# Patient Record
Sex: Female | Born: 1975 | Race: White | Hispanic: No | Marital: Single | Smoking: Never smoker
Health system: Southern US, Community
[De-identification: ages and names within clinical notes are randomized; demographics above are authoritative.]

## PROBLEM LIST (undated history)

## (undated) DIAGNOSIS — D649 Anemia, unspecified: Secondary | ICD-10-CM

## (undated) HISTORY — PX: LAPAROSCOPIC GASTRIC BANDING: SHX1100

## (undated) HISTORY — PX: APPENDECTOMY: SHX54

## (undated) HISTORY — PX: ABDOMINAL HYSTERECTOMY: SHX81

---

## 2011-07-31 DIAGNOSIS — Z9884 Bariatric surgery status: Secondary | ICD-10-CM | POA: Insufficient documentation

## 2015-01-25 DIAGNOSIS — J302 Other seasonal allergic rhinitis: Secondary | ICD-10-CM | POA: Insufficient documentation

## 2015-01-25 DIAGNOSIS — G43109 Migraine with aura, not intractable, without status migrainosus: Secondary | ICD-10-CM | POA: Insufficient documentation

## 2016-12-07 HISTORY — PX: BILATERAL SALPINGOOPHORECTOMY: SHX1223

## 2017-01-05 ENCOUNTER — Ambulatory Visit (INDEPENDENT_AMBULATORY_CARE_PROVIDER_SITE_OTHER): Payer: 59 | Admitting: Family Medicine

## 2017-01-05 ENCOUNTER — Encounter: Payer: Self-pay | Admitting: Family Medicine

## 2017-01-05 DIAGNOSIS — M25561 Pain in right knee: Secondary | ICD-10-CM | POA: Diagnosis not present

## 2017-01-05 MED ORDER — DICLOFENAC SODIUM 75 MG PO TBEC: 75.0000 mg | DELAYED_RELEASE_TABLET | Freq: Two times a day (BID) | ORAL | 1 refills | Status: DC

## 2017-01-05 NOTE — Patient Instructions (Signed)
We will try to get records of the MRI, office notes from the doctor you saw in [place]. These are the different medicines you can take for arthritis: Tylenol 500mg  1-2 tabs three times a day for pain. Voltaren twice a day with food Glucosamine sulfate 750mg  twice a day is a supplement that may help. Capsaicin, aspercreme, or biofreeze topically up to four times a day may also help with pain. Cortisone injections are an option. If cortisone injections do not help, there are different types of shots that may help but they take longer to take effect. It's important that you continue to stay active. Straight leg raises, knee extensions 3 sets of 10 once a day (add ankle weight if these become too easy). Consider physical therapy to strengthen muscles around the joint that hurts to take pressure off of the joint itself. Shoe inserts with good arch support may be helpful. Heat or ice 15 minutes at a time 3-4 times a day as needed to help with pain. Water aerobics and cycling with low resistance are the best two types of exercise for arthritis. Follow up with me if you want to proceed with any of the above.

## 2017-01-06 DIAGNOSIS — M25561 Pain in right knee: Secondary | ICD-10-CM | POA: Insufficient documentation

## 2017-01-06 NOTE — Assessment & Plan Note (Signed)
will obtain records including MRI.  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  Start voltaren.  Tylenol, glucosamine, topical medications discussed.  Shown home exercises to do daily.  Declined physical therapy and intraarticular injection for now.  Heat/ice.  F/u with me if wants to try injection.

## 2017-01-06 NOTE — Progress Notes (Signed)
PCP: No primary care provider on file.  Subjective:   HPI: Patient is a 41 y.o. female here for right knee pain.  Patient reports she fell several times a year ago. Has had swelling of knee, pain level is up to 7/10 and sharp anterior knee. This occurred when she was in [place] - saw physician there and per her report had MRI showing only arthritis and kneecap not tracking properly. Has tried aleve and icing. Did not do physical therapy or injection. No skin changes, numbness.  No past medical history on file.  No current outpatient prescriptions on file prior to visit.   No current facility-administered medications on file prior to visit.     No past surgical history on file.  Allergies  Allergen Reactions  . Codeine   . Penicillins     Social History   Social History  . Marital status: Single    Spouse name: N/A  . Number of children: N/A  . Years of education: N/A   Occupational History  . Not on file.   Social History Main Topics  . Smoking status: Never Smoker  . Smokeless tobacco: Never Used  . Alcohol use Not on file  . Drug use: Unknown  . Sexual activity: Not on file   Other Topics Concern  . Not on file   Social History Narrative  . No narrative on file    No family history on file.  BP 114/84   Pulse 64   Ht 5\' 4"  (1.626 m)   Wt 240 lb (108.9 kg)   BMI 41.20 kg/m   Review of Systems: See HPI above.     Objective:  Physical Exam:  Gen: NAD, comfortable in exam room  Right knee: No gross deformity, ecchymoses, effusion. TTP medial joint line and post patellar facets. FROM. Negative ant/post drawers. Negative valgus/varus testing. Negative lachmanns. Pain with mcmurrays, apleys, patellar apprehension. NV intact distally.  Left knee: FROM without pain.   Assessment & Plan:  1. Right knee pain - will obtain records including MRI.  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  Start voltaren.   Tylenol, glucosamine, topical medications discussed.  Shown home exercises to do daily.  Declined physical therapy and intraarticular injection for now.  Heat/ice.  F/u with me if wants to try injection.

## 2017-02-10 ENCOUNTER — Ambulatory Visit (INDEPENDENT_AMBULATORY_CARE_PROVIDER_SITE_OTHER): Payer: 59 | Admitting: Family Medicine

## 2017-02-10 ENCOUNTER — Encounter: Payer: Self-pay | Admitting: Family Medicine

## 2017-02-10 VITALS — BP 118/72 | HR 81 | Temp 98.2°F | Ht 64.5 in | Wt 271.0 lb

## 2017-02-10 DIAGNOSIS — B372 Candidiasis of skin and nail: Secondary | ICD-10-CM

## 2017-02-10 DIAGNOSIS — Z13220 Encounter for screening for lipoid disorders: Secondary | ICD-10-CM

## 2017-02-10 DIAGNOSIS — Z1329 Encounter for screening for other suspected endocrine disorder: Secondary | ICD-10-CM | POA: Diagnosis not present

## 2017-02-10 DIAGNOSIS — E559 Vitamin D deficiency, unspecified: Secondary | ICD-10-CM | POA: Insufficient documentation

## 2017-02-10 DIAGNOSIS — E669 Obesity, unspecified: Secondary | ICD-10-CM | POA: Insufficient documentation

## 2017-02-10 DIAGNOSIS — Z131 Encounter for screening for diabetes mellitus: Secondary | ICD-10-CM

## 2017-02-10 DIAGNOSIS — R7303 Prediabetes: Secondary | ICD-10-CM | POA: Insufficient documentation

## 2017-02-10 DIAGNOSIS — D509 Iron deficiency anemia, unspecified: Secondary | ICD-10-CM | POA: Diagnosis not present

## 2017-02-10 DIAGNOSIS — R600 Localized edema: Secondary | ICD-10-CM

## 2017-02-10 DIAGNOSIS — R203 Hyperesthesia: Secondary | ICD-10-CM

## 2017-02-10 LAB — HEMOGLOBIN A1C: HEMOGLOBIN A1C: 5.8 % (ref 4.6–6.5)

## 2017-02-10 LAB — COMPREHENSIVE METABOLIC PANEL
ALBUMIN: 3.8 g/dL (ref 3.5–5.2)
ALT: 10 U/L (ref 0–35)
AST: 10 U/L (ref 0–37)
Alkaline Phosphatase: 100 U/L (ref 39–117)
BUN: 8 mg/dL (ref 6–23)
CALCIUM: 9.1 mg/dL (ref 8.4–10.5)
CHLORIDE: 104 meq/L (ref 96–112)
CO2: 29 mEq/L (ref 19–32)
CREATININE: 0.71 mg/dL (ref 0.40–1.20)
GFR: 96.99 mL/min (ref >60.00)
Glucose, Bld: 108 mg/dL — ABNORMAL HIGH (ref 70–99)
POTASSIUM: 3.9 meq/L (ref 3.5–5.1)
Sodium: 138 mEq/L (ref 135–145)
Total Bilirubin: 0.4 mg/dL (ref 0.2–1.2)
Total Protein: 7 g/dL (ref 6.0–8.3)

## 2017-02-10 LAB — CBC
HEMATOCRIT: 38.2 % (ref 36.0–46.0)
HEMOGLOBIN: 12.8 g/dL (ref 12.0–15.0)
MCHC: 33.4 g/dL (ref 30.0–36.0)
MCV: 84.2 fl (ref 78.0–100.0)
PLATELETS: 356 10*3/uL (ref 150.0–400.0)
RBC: 4.53 Mil/uL (ref 3.87–5.11)
RDW: 13.9 % (ref 11.5–15.5)
WBC: 9 10*3/uL (ref 4.0–10.5)

## 2017-02-10 LAB — LIPID PANEL
CHOLESTEROL: 145 mg/dL (ref 0–200)
HDL: 58.7 mg/dL (ref >39.00)
LDL Cholesterol: 67 mg/dL (ref 0–99)
NonHDL: 86.09
Total CHOL/HDL Ratio: 2
Triglycerides: 95 mg/dL (ref 0.0–149.0)
VLDL: 19 mg/dL (ref 0.0–40.0)

## 2017-02-10 LAB — FERRITIN: Ferritin: 138 ng/mL (ref 10.0–291.0)

## 2017-02-10 LAB — TSH: TSH: 1.96 u[IU]/mL (ref 0.35–4.50)

## 2017-02-10 LAB — VITAMIN D 25 HYDROXY (VIT D DEFICIENCY, FRACTURES): VITD: 33.13 ng/mL (ref 30.00–100.00)

## 2017-02-10 MED ORDER — FERROUS SULFATE 325 (65 FE) MG PO TBEC: 325.0000 mg | DELAYED_RELEASE_TABLET | Freq: Two times a day (BID) | ORAL | 1 refills | Status: AC | PRN

## 2017-02-10 MED ORDER — ERGOCALCIFEROL 1.25 MG (50000 UT) PO CAPS: 50000.0000 [IU] | ORAL_CAPSULE | ORAL | 3 refills | Status: DC

## 2017-02-10 MED ORDER — TIZANIDINE HCL 4 MG PO TABS: 4.0000 mg | ORAL_TABLET | Freq: Three times a day (TID) | ORAL | 2 refills | Status: DC | PRN

## 2017-02-10 MED FILL — FERROUS SULFATE 325 MG TAB: 325 (65 FE) | 100 days supply | Qty: 200 | Fill #0

## 2017-02-10 MED FILL — VIT D2 1.25 MG (50,000 UNIT: 1.25 MG | 28 days supply | Qty: 4 | Fill #0

## 2017-02-10 MED FILL — tiZANidine HCL 4 MG TABS: 4 | 10 days supply | Qty: 30 | Fill #0

## 2017-02-10 NOTE — Progress Notes (Unsigned)
Faxed request for records to Dr. Hiram Gashwight Pridhams office.

## 2017-02-10 NOTE — Progress Notes (Signed)
[facility] [address], [address] [place], [place] [address] [phone]  Date:  02/10/2017   Name:  Brianna Raymond   DOB:  February 01, 1976   MRN:  [id number]  PCP:  Abbe Amsterdam, MD    Chief Complaint: Establish Care (Pt here to est care. Would like to discuss getting derm referral )   History of Present Illness:  Brianna Raymond is a 41 y.o. very pleasant female patient who presents with the following:  Here today as a new patient-   She has seen Dr. Pearletha Forge for knee pain earlier this year She has moved to this area from [facility] for her job.  She works as a Estate agent.  Admits that she is not very active at her job and that she has gained some weight due to inactivity   She had a hysterectomy for painful periods a few years ago- she had a combination of fibroids and endometriosis. She then had both of her ovaries removed about 18 months ago due to cysts.  She is now on oral estrogen and has not really noted any sx of menopause   She has several meds with her but they are generally just certrizine, vitamins (vitamin D) and NSAIDs  She is on estradiol daily She has a history of chronic pelvic spasms- better since her surgery.  She does use zanaflex prn for this and does need a refill  History of low vitamin D and iron deficiency as well  She does have a history of anemia which appeared 6-7 years ago. Has not gone away since her hysterectomy.  She does take iron between 325 and 650 mg a day  She has a history of very sensitive skin. She has a few areas that concern her and she would like to see dermatology.  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 Iron deficiency anemia 02/10/2017  . Vitamin D deficiency 02/10/2017  . Obesity 02/10/2017  . Right knee pain 01/06/2017    No past medical history on file.  No past surgical history on file.  Social History  Substance Use Topics  . Smoking status: Never Smoker  . Smokeless tobacco: Never Used  . Alcohol use Not on file    No family history on file.  Allergies  Allergen Reactions  . Penicillins Shortness Of Breath and Swelling  . Codeine Nausea And Vomiting    Medication list has been reviewed and updated.  Current Outpatient Prescriptions on File Prior to Visit  Medication Sig Dispense Refill  . diclofenac (VOLTAREN) 75 MG EC tablet Take 1 tablet (75 mg total) by mouth 2 (two) times daily. 60 tablet 1   No current facility-administered medications on file prior to visit.     Review of Systems:  As per HPI- otherwise negative.   Physical Examination: Vitals:   02/10/17 0951  BP: 118/72  Pulse: 81  Temp: 98.2 F (36.8 C)   Vitals:   02/10/17 0951  Weight: 271 lb (122.9 kg)  Height: 5' 4.5" (1.638 m)   Body mass index is  45.8 kg/m. Ideal Body Weight: Weight in (lb) to have BMI = 25: 147.6  GEN: WDWN, NAD, Non-toxic, A & O x 3, obese, otherwise looks well HEENT: Atraumatic, Normocephalic. Neck supple. No masses, No LAD.  Bilateral TM wnl, oropharynx normal.  PEERL,EOMI.   Ears and Nose: No external deformity. CV: RRR, No M/G/R. No JVD. No thrill. No extra heart sounds. PULM: CTA B, no wheezes, crackles, rhonchi. No retractions. No resp. distress. No accessory muscle use. EXTR: No c/c/e.  She does not have any significant edema of her legs at this time  NEURO Normal gait.  PSYCH: Normally interactive. Conversant. Not depressed or anxious appearing.  Calm demeanor.  She has a mild, scaly rash under her breasts which may be candida intertrigo   Assessment and Plan: Iron deficiency anemia, unspecified iron deficiency anemia  type - Plan: CBC, Ferritin, ferrous sulfate 325 (65 FE) MG EC tablet  Vitamin D deficiency - Plan: Vitamin D (25 hydroxy), ergocalciferol (VITAMIN D2) 50000 units capsule  Screening for thyroid disorder - Plan: TSH  Screening for diabetes mellitus - Plan: Comprehensive metabolic panel, Hemoglobin A1c  Candidal intertrigo - Plan: Ambulatory referral to Dermatology  Screening for hyperlipidemia - Plan: Lipid panel  Sensitive skin - Plan: Ambulatory referral to Dermatology  Obesity, unspecified classification, unspecified obesity type, unspecified whether serious comorbidity present  Bilateral leg edema  Here today to establish care and go over a few chronic issues Labs pending as above Will check the status of her CBC/ iron/ vitamin D level Referral to derm See patient instructions for more details.   ped  Signed Abbe AmsterdamJessica Noelle Hoogland, MD

## 2017-02-10 NOTE — Progress Notes (Signed)
Pre visit review using our clinic review tool, if applicable. No additional management support is needed unless otherwise documented below in the visit note. 

## 2017-02-10 NOTE — Progress Notes (Signed)
Results for orders placed or performed in visit on 02/10/17  CBC  Result Value Ref Range   WBC 9.0 4.0 - 10.5 K/uL   RBC 4.53 3.87 - 5.11 Mil/uL   Platelets 356.0 150.0 - 400.0 K/uL   Hemoglobin 12.8 12.0 - 15.0 g/dL   HCT 32.438.2 40.136.0 - 02.746.0 %   MCV 84.2 78.0 - 100.0 fl   MCHC 33.4 30.0 - 36.0 g/dL   RDW 25.313.9 66.411.5 - 40.315.5 %  Comprehensive metabolic panel  Result Value Ref Range   Sodium 138 135 - 145 mEq/L   Potassium 3.9 3.5 - 5.1 mEq/L   Chloride 104 96 - 112 mEq/L   CO2 29 19 - 32 mEq/L   Glucose, Bld 108 (H) 70 - 99 mg/dL   BUN 8 6 - 23 mg/dL   Creatinine, Ser 4.740.71 0.40 - 1.20 mg/dL   Total Bilirubin 0.4 0.2 - 1.2 mg/dL   Alkaline Phosphatase 100 39 - 117 U/L   AST 10 0 - 37 U/L   ALT 10 0 - 35 U/L   Total Protein 7.0 6.0 - 8.3 g/dL   Albumin 3.8 3.5 - 5.2 g/dL   Calcium 9.1 8.4 - 25.910.5 mg/dL   GFR 56.3896.99 >75.64>60.00 mL/min  TSH  Result Value Ref Range   TSH 1.96 0.35 - 4.50 uIU/mL  Lipid panel  Result Value Ref Range   Cholesterol 145 0 - 200 mg/dL   Triglycerides 33.295.0 0.0 - 149.0 mg/dL   HDL 95.1858.70 >84.16>39.00 mg/dL   VLDL 60.619.0 0.0 - 30.140.0 mg/dL   LDL Cholesterol 67 0 - 99 mg/dL   Total CHOL/HDL Ratio 2    NonHDL 86.09   Hemoglobin A1c  Result Value Ref Range   Hgb A1c MFr Bld 5.8 4.6 - 6.5 %  Ferritin  Result Value Ref Range   Ferritin 138.0 10.0 - 291.0 ng/mL  Vitamin D (25 hydroxy)  Result Value Ref Range   VITD 33.13 30.00 - 100.00 ng/mL

## 2017-02-10 NOTE — Patient Instructions (Signed)
It was very nice to see you today- [facility] We will check your iron, vitamin D, thyroid, cholesterol and blood sugar today- I will be in touch with your results asap Try adding some OTC flonase to your current regimen for allergy symptoms Compression sock and more frequent walking around will help with leg swelling.  A walk after you get finished working can help too to get fluid out!  I will refer you to dermatology in the meantime try an OTC anti-fungal cream such as clotrimazole for the rash on your trunk

## 2017-02-22 ENCOUNTER — Encounter: Payer: Self-pay | Admitting: Family Medicine

## 2017-03-18 ENCOUNTER — Other Ambulatory Visit: Payer: Self-pay

## 2017-03-18 MED ORDER — ESTRADIOL 2 MG PO TABS: 2.0000 mg | ORAL_TABLET | Freq: Every day | ORAL | 0 refills | Status: DC

## 2017-03-18 MED FILL — ESTRADIOL 2 MG TABLET: 2 | 30 days supply | Qty: 30 | Fill #0

## 2017-05-26 ENCOUNTER — Telehealth: Payer: Self-pay | Admitting: Family Medicine

## 2017-05-26 ENCOUNTER — Other Ambulatory Visit: Payer: Self-pay | Admitting: Emergency Medicine

## 2017-05-26 MED ORDER — DICLOFENAC SODIUM 75 MG PO TBEC: 75.0000 mg | DELAYED_RELEASE_TABLET | Freq: Two times a day (BID) | ORAL | 1 refills | Status: DC

## 2017-05-26 MED FILL — ESTRADIOL 2 MG TABLET: 2 | 30 days supply | Qty: 30 | Fill #1

## 2017-05-26 MED FILL — VIT D2 1.25 MG (50,000 UNIT: 1.25 MG | 28 days supply | Qty: 4 | Fill #1

## 2017-05-26 MED FILL — DICLOFENAC SODIUM 75 MG TAB: 75 | 30 days supply | Qty: 60 | Fill #0

## 2017-05-26 NOTE — Telephone Encounter (Signed)
Refill sent per pt request.  

## 2017-05-26 NOTE — Telephone Encounter (Signed)
Called patient. Patient states the medication is working so she is going to stay on the medication now versus physical therapy. Patient will call us if she changes her mind.

## 2017-05-26 NOTE — Telephone Encounter (Signed)
Caller name: Relationship to patient: Self Can be reached: [phone]  Pharmacy:  [facility] [place] - [address] [phone] (Phone) [phone] (Fax)   Reason for call: Refill diclofenac (VOLTAREN) 75 MG EC tablet

## 2017-05-26 NOTE — Telephone Encounter (Signed)
It looks like her PCP refilled it for her already.  I'd just mention to her also if she's still having problems and wants to do physical therapy to let us know.  Thanks!

## 2017-05-26 NOTE — Telephone Encounter (Signed)
Requesting refill of Diclofenac, states medication has helped with pain of right knee  Patient was last seen in January 2018. Does she need another visit for reevaluation before medication refill?

## 2017-07-16 ENCOUNTER — Encounter (HOSPITAL_BASED_OUTPATIENT_CLINIC_OR_DEPARTMENT_OTHER): Payer: Self-pay

## 2017-07-16 ENCOUNTER — Emergency Department (HOSPITAL_BASED_OUTPATIENT_CLINIC_OR_DEPARTMENT_OTHER)
Admission: EM | Admit: 2017-07-16 | Discharge: 2017-07-16 | Disposition: A | Discharge status: Home / Self Care | Payer: 59 | Attending: Emergency Medicine | Admitting: Emergency Medicine

## 2017-07-16 DIAGNOSIS — R21 Rash and other nonspecific skin eruption: Secondary | ICD-10-CM | POA: Diagnosis not present

## 2017-07-16 DIAGNOSIS — Z79899 Other long term (current) drug therapy: Secondary | ICD-10-CM | POA: Insufficient documentation

## 2017-07-16 HISTORY — DX: Anemia, unspecified: D64.9

## 2017-07-16 MED ORDER — TRAMADOL HCL 50 MG PO TABS: 50.0000 mg | ORAL_TABLET | Freq: Four times a day (QID) | ORAL | 0 refills | Status: DC | PRN

## 2017-07-16 MED ORDER — KETOCONAZOLE 2 % EX CREA: 1.0000 "application " | TOPICAL_CREAM | Freq: Every day | CUTANEOUS | 0 refills | Status: DC

## 2017-07-16 MED ORDER — TRAMADOL HCL 50 MG PO TABS
50.0000 mg | ORAL_TABLET | Freq: Once | ORAL | Status: AC
  Administered 2017-07-16: 50 mg via ORAL
  Filled 2017-07-16: qty 1

## 2017-07-16 NOTE — Discharge Instructions (Signed)
Use antifungal medicine 1-2 times a day for the next 2-4 weeks Keep area as try as possible - dry completely after showering and use a drying powder Stop steroid ointment Take pain medicine as needed Follow up with your doctor

## 2017-07-16 NOTE — ED Notes (Signed)
Pt teaching provided on medications that may cause drowsiness. Pt instructed not to drive or operate heavy machinery while taking the prescribed medication. Pt verbalized understanding.   

## 2017-07-16 NOTE — ED Triage Notes (Signed)
Pt has a painful rash under breast folds, panis folds, and inguinal folds. Pt has been using exemia cream without relief.

## 2017-07-16 NOTE — ED Provider Notes (Signed)
[facility] Provider Note   CSN: [id number] Arrival date & time: 07/16/17  1722     History   Chief Complaint Chief Complaint  Patient presents with  . Rash    HPI Brianna Raymond is a 41 y.o. female who presents with a rash. She states she's had this for several months. She seemed dermatology who prescribed her triamcinolone. She has been using this with good relief up until last week. Usually she uses the cream for a couple of days and the rash goes away. However this time she has been using the cream and the rash has been persistent and worsening. The rash is located underneath bilateral breasts under knees the skin folds of the pannus and in the inguinal area on the groin bilaterally. It is itchy, very painful, malodorous. She has never had anything like this before. She has been trying to keep the area dry and use baby powder with no relief. She denies any fever.  HPI  Past Medical History:  Diagnosis Date  . Anemia     Patient Active Problem List   Diagnosis Date Noted  . Iron deficiency anemia 02/10/2017  . Vitamin D deficiency 02/10/2017  . Obesity 02/10/2017  . Pre-diabetes 02/10/2017  . Right knee pain 01/06/2017    Past Surgical History:  Procedure Laterality Date  . ABDOMINAL HYSTERECTOMY    . APPENDECTOMY    . BILATERAL SALPINGOOPHORECTOMY N/A 2018   Dr. Doristine JohnsPridham  . CESAREAN SECTION    . LAPAROSCOPIC GASTRIC BANDING      OB History    No data available       Home Medications    Prior to Admission medications   Medication Sig Start Date End Date Taking? Authorizing Provider  cetirizine (ZYRTEC) 10 MG tablet Take 10 mg by mouth daily.    [provider]  diclofenac (VOLTAREN) 75 MG EC tablet Take 1 tablet (75 mg total) by mouth 2 (two) times daily. 05/26/17   Copland, Gwenlyn FoundJessica C, MD  ergocalciferol (VITAMIN D2) 50000 units capsule Take 1 capsule (50,000 Units total) by mouth once a week. 02/10/17   Copland, Gwenlyn FoundJessica C, MD  estradiol  (ESTRACE) 2 MG tablet Take 1 tablet (2 mg total) by mouth daily. 03/18/17   Copland, Gwenlyn FoundJessica C, MD  ferrous sulfate 325 (65 FE) MG EC tablet Take 1 tablet (325 mg total) by mouth 2 (two) times daily as needed. Take 1 or 2 tablets daily 02/10/17   Copland, Gwenlyn FoundJessica C, MD  ibuprofen (ADVIL,MOTRIN) 600 MG tablet Take 600 mg by mouth every 6 (six) hours as needed for moderate pain.    [provider]  ketoconazole (NIZORAL) 2 % cream Apply 1 application topically daily. 07/16/17   Bethel BornGekas, Zakk Borgen Marie, PA-C  tiZANidine (ZANAFLEX) 4 MG tablet Take 1 tablet (4 mg total) by mouth every 8 (eight) hours as needed for muscle spasms. 02/10/17   Copland, Gwenlyn FoundJessica C, MD  traMADol (ULTRAM) 50 MG tablet Take 1 tablet (50 mg total) by mouth every 6 (six) hours as needed. 07/16/17   Bethel BornGekas, Lavell Supple Marie, PA-C    Family History No family history on file.  Social History Social History  Substance Use Topics  . Smoking status: Never Smoker  . Smokeless tobacco: Never Used  . Alcohol use No     Allergies   Penicillins and Codeine   Review of Systems Review of Systems  Constitutional: Negative for fever.  Skin: Positive for rash.       +itching  Physical Exam Updated Vital Signs BP (!) 122/101 (BP Location: Right Arm)   Pulse 92   Temp 98.9 F (37.2 C) (Oral)   Resp 20   Ht 5\' 4"  (1.626 m)   Wt 117.9 kg (260 lb)   SpO2 100%   BMI 44.63 kg/m   Physical Exam  Constitutional: She is oriented to person, place, and time. She appears well-developed and well-nourished. No distress.  HENT:  Head: Normocephalic and atraumatic.  Eyes: Pupils are equal, round, and reactive to light. Conjunctivae are normal. Right eye exhibits no discharge. Left eye exhibits no discharge. No scleral icterus.  Neck: Normal range of motion.  Cardiovascular: Normal rate.   Pulmonary/Chest: Effort normal. No respiratory distress.  Abdominal: She exhibits no distension.  Neurological: She is alert and oriented to  person, place, and time.  Skin: Skin is warm and dry. Rash (Erythematous, raised, scaly rash underneath bilateral breasts, under pannus over the flank bialterally, and in bilateral inguinal area. Tender to touch. No drainage) noted.  Psychiatric: She has a normal mood and affect. Her behavior is normal.  Nursing note and vitals reviewed.    ED Treatments / Results  Labs (all labs ordered are listed, but only abnormal results are displayed) Labs Reviewed - No data to display  EKG  EKG Interpretation None       Radiology No results found.  Procedures Procedures (including critical care time)  Medications Ordered in ED Medications  traMADol (ULTRAM) tablet 50 mg (50 mg Oral Given 07/16/17 1959)     Initial Impression / Assessment and Plan / ED Course  I have reviewed the triage vital signs and the nursing notes.  Pertinent labs & imaging results that were available during my care of the patient were reviewed by me and considered in my medical decision making (see chart for details).  41 year old female with rash consistent with candidal intertrigo. Will treat with ketoconazole cream and advised supportive measures such as keeping the area dry as possible. Advised to avoid steroids since her main concern is pain at this time and not itching. Advised follow-up with her PCP or dermatologist.  Final Clinical Impressions(s) / ED Diagnoses   Final diagnoses:  Rash and nonspecific skin eruption    New Prescriptions New Prescriptions   KETOCONAZOLE (NIZORAL) 2 % CREAM    Apply 1 application topically daily.   TRAMADOL (ULTRAM) 50 MG TABLET    Take 1 tablet (50 mg total) by mouth every 6 (six) hours as needed.     Bethel Born, PA-C 07/16/17 Jeanann Lewandowsky, MD 07/16/17 2113

## 2017-07-21 MED FILL — VIT D2 1.25 MG (50,000 UNIT: 1.25 MG | 28 days supply | Qty: 4 | Fill #2

## 2017-07-21 MED FILL — tiZANidine HCL 4 MG TABS: 4 | 10 days supply | Qty: 30 | Fill #1

## 2017-07-21 MED FILL — ESTRADIOL 2 MG TABLET: 2 | 30 days supply | Qty: 30 | Fill #2

## 2017-08-23 ENCOUNTER — Other Ambulatory Visit: Payer: Self-pay | Admitting: Family Medicine

## 2017-08-24 ENCOUNTER — Ambulatory Visit (INDEPENDENT_AMBULATORY_CARE_PROVIDER_SITE_OTHER): Payer: 59 | Admitting: Family Medicine

## 2017-08-24 ENCOUNTER — Encounter: Payer: Self-pay | Admitting: Family Medicine

## 2017-08-24 VITALS — BP 124/82 | HR 96 | Temp 98.2°F | Ht 64.0 in | Wt 273.6 lb

## 2017-08-24 DIAGNOSIS — L600 Ingrowing nail: Secondary | ICD-10-CM | POA: Diagnosis not present

## 2017-08-24 MED ORDER — DOXYCYCLINE HYCLATE 100 MG PO TABS: 100.0000 mg | ORAL_TABLET | Freq: Two times a day (BID) | ORAL | 0 refills | Status: DC

## 2017-08-24 NOTE — Progress Notes (Signed)
Patient ID: Brianna Raymond, female    DOB: [birth year]  Age: 41 y.o. MRN: [id number]    Subjective:  Subjective  HPI Brianna Raymond presents for infected ingrown toenail R toe.  No other complaints.   Review of Systems  Constitutional: Negative for activity change, appetite change and unexpected weight change.  Respiratory: Negative for cough and shortness of breath.   Cardiovascular: Negative for chest pain and palpitations.  Psychiatric/Behavioral: Negative for behavioral problems and dysphoric mood. The patient is not nervous/anxious.     History Past Medical History:  Diagnosis Date  . Anemia     She has a past surgical history that includes Bilateral salpingoophorectomy (N/A, 2018); Appendectomy; Abdominal hysterectomy; Cesarean section; and Laparoscopic gastric banding.   Her family history is not on file.She reports that she has never smoked. She has never used smokeless tobacco. She reports that she does not drink alcohol or use drugs.  Current Outpatient Prescriptions on File Prior to Visit  Medication Sig Dispense Refill  . cetirizine (ZYRTEC) 10 MG tablet Take 10 mg by mouth daily.    . diclofenac (VOLTAREN) 75 MG EC tablet Take 1 tablet (75 mg total) by mouth 2 (two) times daily. 60 tablet 1  . ergocalciferol (VITAMIN D2) 50000 units capsule Take 1 capsule (50,000 Units total) by mouth once a week. 12 capsule 3  . estradiol (ESTRACE) 2 MG tablet Take 1 tablet (2 mg total) by mouth daily. 90 tablet 0  . ferrous sulfate 325 (65 FE) MG EC tablet Take 1 tablet (325 mg total) by mouth 2 (two) times daily as needed. Take 1 or 2 tablets daily 180 tablet 1  . ibuprofen (ADVIL,MOTRIN) 600 MG tablet Take 600 mg by mouth every 6 (six) hours as needed for moderate pain.    Marland Kitchen ketoconazole (NIZORAL) 2 % cream Apply 1 application topically daily. 30 g 0  . tiZANidine (ZANAFLEX) 4 MG tablet Take 1 tablet (4 mg total) by mouth every 8 (eight) hours as needed for muscle spasms. 30 tablet 2  .  traMADol (ULTRAM) 50 MG tablet Take 1 tablet (50 mg total) by mouth every 6 (six) hours as needed. 15 tablet 0   No current facility-administered medications on file prior to visit.      Objective:  Objective  Physical Exam  Musculoskeletal:       Feet:  Nursing note and vitals reviewed.  BP 124/82 (BP Location: Right Arm, Patient Position: Sitting, Cuff Size: Large)   Pulse 96   Temp 98.2 F (36.8 C) (Oral)   Ht  (1.626 m)   Wt 273 lb 9.6 oz (124.1 kg)   SpO2 95%   BMI 46.96 kg/m  Wt Readings from Last 3 Encounters:  08/24/17 273 lb 9.6 oz (124.1 kg)  07/16/17 260 lb (117.9 kg)  02/10/17 271 lb (122.9 kg)     Lab Results  Component Value Date   WBC 9.0 02/10/2017   HGB 12.8 02/10/2017   HCT 38.2 02/10/2017   PLT 356.0 02/10/2017   GLUCOSE 108 (H) 02/10/2017   CHOL 145 02/10/2017   TRIG 95.0 02/10/2017   HDL 58.70 02/10/2017   LDLCALC 67 02/10/2017   ALT 10 02/10/2017   AST 10 02/10/2017   NA 138 02/10/2017   K 3.9 02/10/2017   CL 104 02/10/2017   CREATININE 0.71 02/10/2017   BUN 8 02/10/2017   CO2 29 02/10/2017   TSH 1.96 02/10/2017   HGBA1C 5.8 02/10/2017    No results found.  Assessment & Plan:  Plan  I am having Brianna Raymond start on doxycycline. I am also having her maintain her ibuprofen, cetirizine, ergocalciferol, ferrous sulfate, tiZANidine, estradiol, diclofenac, ketoconazole, and traMADol.  Meds ordered this encounter  Medications  . doxycycline (VIBRA-TABS) 100 MG tablet    Sig: Take 1 tablet (100 mg total) by mouth 2 (two) times daily.    Dispense:  20 tablet    Refill:  0    Problem List Items Addressed This Visit    None    Visit Diagnoses    Ingrown toenail of left foot with infection    -  Primary   Relevant Medications   doxycycline (VIBRA-TABS) 100 MG tablet   Other Relevant Orders   Ambulatory referral to Podiatry      Follow-up: Return if symptoms worsen or fail to improve.  Donato Schultz, DO

## 2017-08-24 NOTE — Patient Instructions (Signed)
Ingrown Toenail An ingrown toenail occurs when the corner or sides of your toenail grow into the surrounding skin. The big toe is most commonly affected, but it can happen to any of your toes. If your ingrown toenail is not treated, you will be at risk for infection. What are the causes? This condition may be caused by:  Wearing shoes that are too small or tight.  Injury or trauma, such as stubbing your toe or having your toe stepped on.  Improper cutting or care of your toenails.  Being born with (congenital) nail or foot abnormalities, such as having a nail that is too big for your toe.  What increases the risk? Risk factors for an ingrown toenail include:  Age. Your nails tend to thicken as you get older, so ingrown nails are more common in older people.  Diabetes.  Cutting your toenails incorrectly.  Blood circulation problems.  What are the signs or symptoms? Symptoms may include:  Pain, soreness, or tenderness.  Redness.  Swelling.  Hardening of the skin surrounding the toe.  Your ingrown toenail may be infected if there is fluid, pus, or drainage. How is this diagnosed? An ingrown toenail may be diagnosed by medical history and physical exam. If your toenail is infected, your health care provider may test a sample of the drainage. How is this treated? Treatment depends on the severity of your ingrown toenail. Some ingrown toenails may be treated at home. More severe or infected ingrown toenails may require surgery to remove all or part of the nail. Infected ingrown toenails may also be treated with antibiotic medicines. Follow these instructions at home:  If you were prescribed an antibiotic medicine, finish all of it even if you start to feel better.  Soak your foot in warm soapy water for 20 minutes, 3 times per day or as directed by your health care provider.  Carefully lift the edge of the nail away from the sore skin by wedging a small piece of cotton under  the corner of the nail. This may help with the pain. Be careful not to cause more injury to the area.  Wear shoes that fit well. If your ingrown toenail is causing you pain, try wearing sandals, if possible.  Trim your toenails regularly and carefully. Do not cut them in a curved shape. Cut your toenails straight across. This prevents injury to the skin at the corners of the toenail.  Keep your feet clean and dry.  If you are having trouble walking and are given crutches by your health care provider, use them as directed.  Do not pick at your toenail or try to remove it yourself.  Take medicines only as directed by your health care provider.  Keep all follow-up visits as directed by your health care provider. This is important. Contact a health care provider if:  Your symptoms do not improve with treatment. Get help right away if:  You have red streaks that start at your foot and go up your leg.  You have a fever.  You have increased redness, swelling, or pain.  You have fluid, blood, or pus coming from your toenail. This information is not intended to replace advice given to you by your health care provider. Make sure you discuss any questions you have with your health care provider. Document Released: 11/20/2000 Document Revised: 04/24/2016 Document Reviewed: 10/17/2014 Elsevier Interactive Patient Education  2018 [employer]  

## 2017-09-23 MED FILL — DICLOFENAC SODIUM 75 MG TAB: 75 | 30 days supply | Qty: 60 | Fill #1

## 2017-09-23 MED FILL — VIT D2 1.25 MG (50,000 UNIT: 1.25 MG | 28 days supply | Qty: 4 | Fill #3

## 2017-09-23 MED FILL — ESTRADIOL 2 MG TABLET: 2 | 30 days supply | Qty: 30 | Fill #0

## 2017-09-23 MED FILL — FERROUS SULFATE 325 MG TAB: 325 (65 FE) | 100 days supply | Qty: 200 | Fill #1

## 2017-10-25 ENCOUNTER — Ambulatory Visit (INDEPENDENT_AMBULATORY_CARE_PROVIDER_SITE_OTHER): Payer: 59 | Admitting: Family Medicine

## 2017-10-25 ENCOUNTER — Encounter: Payer: Self-pay | Admitting: Family Medicine

## 2017-10-25 VITALS — BP 135/86 | HR 89 | Ht 64.0 in | Wt 269.6 lb

## 2017-10-25 DIAGNOSIS — M25561 Pain in right knee: Secondary | ICD-10-CM | POA: Diagnosis not present

## 2017-10-25 MED ORDER — MELOXICAM 15 MG PO TABS: 15.0000 mg | ORAL_TABLET | Freq: Every day | ORAL | 2 refills | Status: AC

## 2017-10-25 MED ORDER — TRAMADOL HCL 50 MG PO TABS: 50.0000 mg | ORAL_TABLET | Freq: Four times a day (QID) | ORAL | 0 refills | Status: DC | PRN

## 2017-10-25 MED FILL — MELOXICAM 15 MG TABLET: 15 | 30 days supply | Qty: 30 | Fill #0

## 2017-10-25 MED FILL — traMADol HCL 50 MG TABS: 50 | 5 days supply | Qty: 20 | Fill #0

## 2017-10-25 NOTE — Patient Instructions (Addendum)
These are the different medicines you can take for arthritis: Tylenol 500mg  1-2 tabs three times a day for pain. Meloxicam 15mg  daily with food for pain and inflammation. STOP the diclofenac. Tramadol as needed for severe pain. Some supplements that may help: boswellia extract, curcumin, pycnogenol. Capsaicin, aspercreme, or biofreeze topically up to four times a day may also help with pain. Cortisone injections are an option - let me know if you want to try this. If cortisone injections do not help, there are different types of shots that may help but they take longer to take effect. It's important that you continue to stay active. Straight leg raises, knee extensions 3 sets of 10 once a day (add ankle weight if these become too easy). I would start physical therapy to strengthen muscles around the joint that hurts to take pressure off of the joint itself and to treat the tracking issue. Shoe inserts with good arch support may be helpful. Heat or ice 15 minutes at a time 3-4 times a day as needed to help with pain. Water aerobics and cycling with low resistance are the best two types of exercise for arthritis. Follow up with me if you want to proceed with any of the above.

## 2017-10-28 ENCOUNTER — Encounter: Payer: Self-pay | Admitting: Family Medicine

## 2017-10-28 NOTE — Assessment & Plan Note (Signed)
2/2 arthritis and patellofemoral syndrome.  MRI from March 2017 without meniscus tear or loose body.  Discussed options - she would like to try meloxicam instead of diclofenac.  Tramadol as needed.  Start physical therapy and continue home exercises.  Consider injection.  F/u if she would like to try injection if not improving.

## 2017-10-28 NOTE — Progress Notes (Signed)
PCP: Copland, Gwenlyn FoundJessica C, MD  Subjective:   HPI: Patient is a 41 y.o. female here for right knee pain.  1/30: Patient reports she fell several times a year ago. Has had swelling of knee, pain level is up to 7/10 and sharp anterior knee. This occurred when she was in [place] - saw physician there and per her report had MRI showing only arthritis and kneecap not tracking properly. Has tried aleve and icing. Did not do physical therapy or injection. No skin changes, numbness.  11/19: Patient returns reporting pain is continuing and feels worse than earlier this year anterior right knee. Swelling at times. No new injury or trauma. Knee pops and gives out at times. Tried voltaren twice a day and home exercises. Pain level 6/10 and sharp anteriorly. No skin changes, numbness.  Past Medical History:  Diagnosis Date  . Anemia     Current Outpatient Medications on File Prior to Visit  Medication Sig Dispense Refill  . fluticasone (FLONASE) 50 MCG/ACT nasal spray Place into the nose.    . cetirizine (ZYRTEC) 10 MG tablet Take 10 mg by mouth daily.    . ergocalciferol (VITAMIN D2) 50000 units capsule Take 1 capsule (50,000 Units total) by mouth once a week. 12 capsule 3  . estradiol (ESTRACE) 2 MG tablet TAKE 1 TABLET (2 MG TOTAL) BY MOUTH DAILY. 90 tablet 0  . ferrous sulfate 325 (65 FE) MG EC tablet Take 1 tablet (325 mg total) by mouth 2 (two) times daily as needed. Take 1 or 2 tablets daily 180 tablet 1  . ibuprofen (ADVIL,MOTRIN) 600 MG tablet Take 600 mg by mouth every 6 (six) hours as needed for moderate pain.     No current facility-administered medications on file prior to visit.     Past Surgical History:  Procedure Laterality Date  . ABDOMINAL HYSTERECTOMY    . APPENDECTOMY    . BILATERAL SALPINGOOPHORECTOMY N/A 2018   Dr. Doristine JohnsPridham  . CESAREAN SECTION    . LAPAROSCOPIC GASTRIC BANDING      Allergies  Allergen Reactions  . Penicillins Shortness Of Breath and  Swelling  . Promethazine Nausea And Vomiting    SEVERE  . Codeine Nausea And Vomiting  . Latex Hives and Swelling    Social History   Socioeconomic History  . Marital status: Single    Spouse name: Not on file  . Number of children: Not on file  . Years of education: Not on file  . Highest education level: Not on file  Social Needs  . Financial resource strain: Not on file  . Food insecurity - worry: Not on file  . Food insecurity - inability: Not on file  . Transportation needs - medical: Not on file  . Transportation needs - non-medical: Not on file  Occupational History  . Not on file  Tobacco Use  . Smoking status: Never Smoker  . Smokeless tobacco: Never Used  Substance and Sexual Activity  . Alcohol use: No  . Drug use: No  . Sexual activity: Not on file  Other Topics Concern  . Not on file  Social History Narrative  . Not on file    History reviewed. No pertinent family history.  BP 135/86   Pulse 89   Ht 5\' 4"  (1.626 m)   Wt 269 lb 9.6 oz (122.3 kg)   BMI 46.28 kg/m   Review of Systems: See HPI above.     Objective:  Physical Exam:  Gen: NAD, comfortable in  exam room.  Right knee: No gross deformity, ecchymoses, swelling. TTP medial joint line and post patellar facets. FROM with 5/5 strength flexion and extension. Negative ant/post drawers. Negative valgus/varus testing. Negative lachmanns. Negative mcmurrays, apleys, patellar apprehension. NV intact distally.  Left knee: No gross deformity, ecchymoses, swelling. No TTP. FROM with full strength. Negative ant/post drawers. Negative valgus/varus testing. NV intact distally.   Assessment & Plan:  1. Right knee pain - 2/2 arthritis and patellofemoral syndrome.  MRI from March 2017 without meniscus tear or loose body.  Discussed options - she would like to try meloxicam instead of diclofenac.  Tramadol as needed.  Start physical therapy and continue home exercises.  Consider injection.  F/u if  she would like to try injection if not improving.

## 2017-11-04 ENCOUNTER — Ambulatory Visit: Payer: 59 | Attending: Family Medicine | Admitting: Physical Therapy

## 2017-11-04 ENCOUNTER — Encounter: Payer: Self-pay | Admitting: Physical Therapy

## 2017-11-04 DIAGNOSIS — R262 Difficulty in walking, not elsewhere classified: Secondary | ICD-10-CM | POA: Diagnosis present

## 2017-11-04 DIAGNOSIS — M25561 Pain in right knee: Secondary | ICD-10-CM | POA: Diagnosis present

## 2017-11-04 DIAGNOSIS — G8929 Other chronic pain: Secondary | ICD-10-CM

## 2017-11-04 DIAGNOSIS — R29898 Other symptoms and signs involving the musculoskeletal system: Secondary | ICD-10-CM | POA: Insufficient documentation

## 2017-11-04 DIAGNOSIS — R2689 Other abnormalities of gait and mobility: Secondary | ICD-10-CM

## 2017-11-04 NOTE — Therapy (Signed)
[facility] [place] [address]  [address] [place], [place], [address] Phone: [phone]   Fax:  [phone]  Physical Therapy Evaluation  Patient Details  Name: Brianna BlenderKari Raymond MRN: [id number] Date of Birth: 07/15/1976 Referring Provider: Dr. Norton BlizzardShane Hudnall   Encounter Date: 11/04/2017  PT End of Session - 11/04/17 1656    Visit Number  1    Number of Visits  12    Date for PT Re-Evaluation  12/16/17    PT Start Time  1702    PT Stop Time  1745    PT Time Calculation (min)  43 min    Activity Tolerance  Patient tolerated treatment well    Behavior During Therapy  [facility] for tasks assessed/performed       Past Medical History:  Diagnosis Date  . Anemia     Past Surgical History:  Procedure Laterality Date  . ABDOMINAL HYSTERECTOMY    . APPENDECTOMY    . BILATERAL SALPINGOOPHORECTOMY N/A 2018   Dr. Doristine JohnsPridham  . CESAREAN SECTION    . LAPAROSCOPIC GASTRIC BANDING      There were no vitals filed for this visit.   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 ~2 years ago, was walking at night, tripped over curb - fell onto knees. Has had knee pain since (R>L). Did wear a brace for short term - but feels like knee has never gotten right. Does report knee buckling daily.     Limitations  Standing;Walking    Patient Stated Goals  improve pain and walking    Currently in Pain?  Yes    Pain Score  4  at rest; 7/10 with walking    Pain Location  Knee    Pain Orientation  Right    Pain Descriptors / Indicators  Aching;Constant;Discomfort    Pain Type  Chronic pain    Pain Onset  More than a month ago    Pain Frequency  Constant    Aggravating Factors   steps    Pain Relieving Factors  resting         [facility] PT Assessment - 11/04/17 1645      Assessment   Medical Diagnosis  R knee pain    Referring Provider  Dr. Norton BlizzardShane Hudnall    Onset Date/Surgical Date  -- ~2  years    Next MD Visit  prn    Prior Therapy  no      Precautions   Precautions  None      Restrictions   Weight Bearing Restrictions  No      Balance Screen   Has the patient fallen in the past 6 months  Yes    How many times?  -- ~1 fall a week    Has the patient had a decrease in activity level because of a fear of falling?   No    Is the patient reluctant to leave their home because of a fear of falling?   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      Cognition   Overall Cognitive Status  Within Functional Limits for tasks assessed      Observation/Other Assessments   Focus on Therapeutic Outcomes (FOTO)   Knee: 33 (67% limited, predicted 46% limited)      Sensation   Light Touch  -- R sided tingling (at knee joint)      Coordination   Gross Motor Movements are Fluid and Coordinated  Yes      ROM / Strength   AROM / PROM / Strength  AROM;Strength      AROM   AROM Assessment Site  Knee    Right/Left Knee  Right    Right Knee Extension  1    Right Knee Flexion  124      Strength   Overall Strength Comments  R LE grossly 4-/5      Palpation   Patella mobility  good - however, very painful with all directions    Palpation comment  TTP along medial and lateral joint lines, anterior patella and surrounding tissue      Ambulation/Gait   Ambulation/Gait  Yes    Ambulation/Gait Assistance  6: Modified independent (Device/Increase time)    Ambulation Distance (Feet)  100 Feet    Assistive device  None    Gait Pattern  Step-through pattern;Decreased step length - left;Decreased stance time  - right;Decreased hip/knee flexion - right;Decreased dorsiflexion - right;Decreased weight shift to right;Antalgic    Ambulation Surface  Level;Indoor    Gait velocity  slow for age    Gait Comments  patient reports buckling instance prior to eval             Objective measurements completed on examination: See above findings.      [facility]/Exercise - 11/04/17 1645      Exercises   Exercises  Knee/Hip      Knee/Hip Exercises: Seated   Long Arc Quad  Right;10 reps    Long Arc Quad Limitations  with pillow squeeze      Knee/Hip Exercises: Supine   Bridges  Both;10 reps    Straight Leg Raises  Right;10 reps    Straight Leg Raise with External Rotation  Right;10 reps      Manual Therapy   Manual Therapy  Taping    Kinesiotex  Create Space      Kinesiotix   Create Space  chondromalacia taping pattern              PT Education - 11/04/17 1655    Education provided  Yes    Education Details  exam findings, POC, HEP    Person(s) Educated  Patient    Methods  Explanation;Demonstration;Handout    Comprehension  Verbalized understanding;Returned demonstration          PT Long Term Goals - 11/04/17 1802      PT LONG TERM GOAL #1   Title  patient to be independent with advanced HEP    Status  New    Target Date  12/16/17      PT LONG TERM GOAL #2   Title  patient to demonstrate good heel toe gait pattern with no evidence of instability     Status  New    Target Date  12/16/17      PT LONG TERM GOAL #3  Title  patient to demonstrate R LE strength to >/= 4+/5 to allow for improved functional mobility    Status  New    Target Date  12/16/17      PT LONG TERM GOAL #4   Title  patient to report no more than 2 falls per month    Status  New    Target Date  12/16/17             Plan - 11/04/17 1810    Clinical Impression Statement  Patient is a 41 y/o female presenting to OPPT today regarding primary complaints of R knee pain s/p  fall onto knees ~2 years ago, consistent with patellofemoral pain. Patient today with abnormal gait mechanics with redued heel strike, reduced knee extension through stane phase of gait as well as reduced weight shift onto R LE. Patient with pain during all palpation and special testing of knee, however, all ligaments seemingly intact with no evidence of laxity. Patient with good patellar mobility, however painful in all directions as well. Taping applied to R knee today for hopefull pain releif as well as initiation of gentle strengthening with good carryover. Patient to benefit from PT to address the above lsited deficits to allow for improved functional mobility and QOL.     Clinical Presentation  Stable    Clinical Decision Making  Low    Rehab Potential  Good    PT Frequency  2x / week    PT Duration  6 weeks    PT Treatment/Interventions  ADLs/Self Care Home Management;Cryotherapy;Electrical Stimulation;Iontophoresis 4mg /ml Dexamethasone;Moist Heat;Therapeutic exercise;Therapeutic activities;Functional mobility training;Stair training;Gait training;Balance training;Ultrasound;Neuromuscular re-education;Patient/family education;Manual techniques;Vasopneumatic Device;Taping;Passive range of motion;Dry needling    Consulted and Agree with Plan of Care  Patient       Patient will benefit from skilled therapeutic intervention in order to improve the following deficits and impairments:  Abnormal gait, Decreased activity tolerance, Decreased balance, Decreased mobility, Decreased strength, Difficulty walking, Pain  Visit Diagnosis: Chronic pain of right knee  Difficulty in walking, not elsewhere classified  Other abnormalities of gait and mobility  Other symptoms and signs involving the musculoskeletal system     Problem List Patient Active Problem List   Diagnosis Date Noted  . Iron deficiency anemia 02/10/2017  . Vitamin D deficiency 02/10/2017  . Obesity 02/10/2017  . Pre-diabetes  02/10/2017  . Right knee pain 01/06/2017  . Migraine with aura and without status migrainosus, not intractable 01/25/2015  . Allergic rhinitis, seasonal 01/25/2015  . Status following gastric banding surgery for weight loss 07/31/2011     Kipp Laurence, PT, DPT 11/04/17 6:15 PM   [facility] [address]  [address] [place], [place], [address] Phone: [phone]   Fax:  [phone]  Name: Brianna Raymond MRN: [id number] Date of Birth: 30-Nov-1976

## 2017-11-04 NOTE — Patient Instructions (Signed)
Straight Leg Raise    Tighten stomach and slowly raise locked right leg. Repeat __10__ times per set. Do __2__ sets per session.    Straight Leg Raise: With External Leg Rotation    Lie on back with right leg straight, opposite leg bent. Rotate straight leg out and lift. Repeat __10__ times per set. Do __2__ sets per session.     Bridge    Lie back, legs bent. Inhale, pressing hips up. Keeping ribs in, lengthen lower back. Exhale, rolling down along spine from top. Repeat __10__ times. Do __2__ sessions per day.    KNEE: Extension, Long Arc Quads - Sitting    Squeeze ball or towel between knees.Raise leg until knee is straight. _10__ reps per set, _2__ sets per day.

## 2017-11-11 ENCOUNTER — Ambulatory Visit: Payer: 59 | Attending: Family Medicine

## 2017-11-11 DIAGNOSIS — R29898 Other symptoms and signs involving the musculoskeletal system: Secondary | ICD-10-CM | POA: Diagnosis present

## 2017-11-11 DIAGNOSIS — R262 Difficulty in walking, not elsewhere classified: Secondary | ICD-10-CM | POA: Diagnosis present

## 2017-11-11 DIAGNOSIS — R2689 Other abnormalities of gait and mobility: Secondary | ICD-10-CM

## 2017-11-11 DIAGNOSIS — M25561 Pain in right knee: Secondary | ICD-10-CM | POA: Diagnosis present

## 2017-11-11 DIAGNOSIS — G8929 Other chronic pain: Secondary | ICD-10-CM

## 2017-11-11 NOTE — Therapy (Signed)
[facility] [address]  [address] [place], [place], [address] Phone: [phone]   Fax:  [phone]  Physical Therapy Treatment  Patient Details  Name: Brianna BlenderKari Raymond MRN: [id number] Date of Birth: 11/04/1976 Referring Provider: Dr. Norton BlizzardShane Hudnall   Encounter Date: 11/11/2017  PT End of Session - 11/11/17 1316    Visit Number  2    Number of Visits  12    Date for PT Re-Evaluation  12/16/17    PT Start Time  1310    PT Stop Time  1358    PT Time Calculation (min)  48 min    Activity Tolerance  Patient tolerated treatment well    Behavior During Therapy  [facility] for tasks assessed/performed       Past Medical History:  Diagnosis Date  . Anemia     Past Surgical History:  Procedure Laterality Date  . ABDOMINAL HYSTERECTOMY    . APPENDECTOMY    . BILATERAL SALPINGOOPHORECTOMY N/A 2018   Dr. Doristine JohnsPridham  . CESAREAN SECTION    . LAPAROSCOPIC GASTRIC BANDING      There were no vitals filed for this visit.  Subjective Assessment - 11/11/17 1314    Subjective  Reports some limitation with HEP performance due to knee pain.      Limitations  Standing;Walking    Patient Stated Goals  improve pain and walking    Currently in Pain?  Yes    Pain Score  2  Pt. reporting R knee pain up to 7/10 with walking     Pain Location  Knee    Pain Orientation  Right    Pain Type  Chronic pain    Pain Onset  More than a month ago    Pain Frequency  Constant    Aggravating Factors   stairs,     Pain Relieving Factors  resting    Multiple Pain Sites  No                      [facility] Adult PT Treatment/Exercise - 11/11/17 1324      Self-Care   Self-Care  Other Self-Care Comments    Other Self-Care Comments   Discussion of HEP to check for tolerance       Knee/Hip Exercises: Aerobic   Recumbent Bike  Lvl 1, 5 - half revolutions      Knee/Hip Exercises: Standing   Terminal Knee Extension  15 reps;Right increased pain      Theraband Level (Terminal Knee Extension)  Level 3 (Green)      Knee/Hip Exercises: Seated   Long Arc Quad  Right;10 reps pain reporting significant pain levels with this    Long Arc Quad Limitations  with pillow squeeze no relief with medial patellar glide       Knee/Hip Exercises: Supine   Heel Slides  Right;10 reps pt. reporting significant pain increase     Heel Slides Limitations  pillow case     Bridges  Both;10 reps reported pain increase     Bridges with Harley-DavidsonBall Squeeze  15 reps;Both improved tolerance from standard bridge     Straight Leg Raises  Right;10 reps    Straight Leg Raise with External Rotation  Right;10 reps    Other Supine Knee/Hip Exercises  HS curl with heels on peanut p-ball x 10 reps       Knee/Hip Exercises: Sidelying   Hip ABduction  Right;15 reps    Hip  ADduction  15 reps;Right;2 sets      Knee/Hip Exercises: Prone   Hip Extension  Right;15 reps      Manual Therapy   Manual Therapy  Soft tissue mobilization    Manual therapy comments  supine     Soft tissue mobilization  STM to lateral quads/ITB; no significant tenderness; remains tender to lateral/anterior R knee                  PT Long Term Goals - 11/11/17 1317      PT LONG TERM GOAL #1   Title  patient to be independent with advanced HEP    Status  On-going      PT LONG TERM GOAL #2   Title  patient to demonstrate good heel toe gait pattern with no evidence of instability     Status  On-going      PT LONG TERM GOAL #3   Title  patient to demonstrate R LE strength to >/= 4+/5 to allow for improved functional mobility    Status  On-going      PT LONG TERM GOAL #4   Title  patient to report no more than 2 falls per month    Status  On-going            Plan - 11/11/17 1317    Clinical Impression Statement  Brianna RistKari seen today reporting knee pain has limited her with performance of bridge and LAQ with HEP.  HEP reviewed with pt. today with improved tolerance for bridge adduction  thus HEP adjusted to include this.  Pt. limited with therex today due to R knee pain and only limited relief with manual medial patellar glide.  Unsure of benefit from tape thus taping deferred today.  Will progress as pt. able in coming visits.    PT Treatment/Interventions  ADLs/Self Care Home Management;Cryotherapy;Electrical Stimulation;Iontophoresis 4mg /ml Dexamethasone;Moist Heat;Therapeutic exercise;Therapeutic activities;Functional mobility training;Stair training;Gait training;Balance training;Ultrasound;Neuromuscular re-education;Patient/family education;Manual techniques;Vasopneumatic Device;Taping;Passive range of motion;Dry needling    Consulted and Agree with Plan of Care  Patient       Patient will benefit from skilled therapeutic intervention in order to improve the following deficits and impairments:  Abnormal gait, Decreased activity tolerance, Decreased balance, Decreased mobility, Decreased strength, Difficulty walking, Pain  Visit Diagnosis: Chronic pain of right knee  Difficulty in walking, not elsewhere classified  Other abnormalities of gait and mobility  Other symptoms and signs involving the musculoskeletal system     Problem List Patient Active Problem List   Diagnosis Date Noted  . Iron deficiency anemia 02/10/2017  . Vitamin D deficiency 02/10/2017  . Obesity 02/10/2017  . Pre-diabetes 02/10/2017  . Right knee pain 01/06/2017  . Migraine with aura and without status migrainosus, not intractable 01/25/2015  . Allergic rhinitis, seasonal 01/25/2015  . Status following gastric banding surgery for weight loss 07/31/2011    Kermit BaloMicah Aeisha Minarik, PTA 11/11/17 6:20 PM  [facility] [address]  [address] [place], [place], [address] Phone: [phone]   Fax:  [phone]  Name: Brianna BlenderKari Raymond MRN: [id number] Date of Birth: 11/19/1976

## 2017-11-12 ENCOUNTER — Other Ambulatory Visit: Payer: Self-pay | Admitting: Medical

## 2017-11-12 ENCOUNTER — Ambulatory Visit (HOSPITAL_BASED_OUTPATIENT_CLINIC_OR_DEPARTMENT_OTHER)
Admission: RE | Admit: 2017-11-12 | Discharge: 2017-11-12 | Disposition: A | Discharge status: Home / Self Care | Payer: 59 | Source: Ambulatory Visit | Attending: Medical | Admitting: Medical

## 2017-11-12 ENCOUNTER — Encounter: Payer: Self-pay | Admitting: Medical

## 2017-11-12 ENCOUNTER — Ambulatory Visit (INDEPENDENT_AMBULATORY_CARE_PROVIDER_SITE_OTHER): Payer: 59 | Admitting: Medical

## 2017-11-12 VITALS — BP 133/78 | HR 83 | Temp 98.1°F | Resp 16 | Wt 271.2 lb

## 2017-11-12 DIAGNOSIS — M25531 Pain in right wrist: Secondary | ICD-10-CM

## 2017-11-12 DIAGNOSIS — G8929 Other chronic pain: Secondary | ICD-10-CM

## 2017-11-12 DIAGNOSIS — M898X2 Other specified disorders of bone, upper arm: Secondary | ICD-10-CM

## 2017-11-12 DIAGNOSIS — M25561 Pain in right knee: Secondary | ICD-10-CM | POA: Insufficient documentation

## 2017-11-12 DIAGNOSIS — M25521 Pain in right elbow: Secondary | ICD-10-CM | POA: Insufficient documentation

## 2017-11-12 DIAGNOSIS — M79631 Pain in right forearm: Secondary | ICD-10-CM | POA: Diagnosis not present

## 2017-11-12 DIAGNOSIS — M79621 Pain in right upper arm: Secondary | ICD-10-CM

## 2017-11-12 MED ORDER — HYDROCODONE-ACETAMINOPHEN 5-325 MG PO TABS: 1.0000 | ORAL_TABLET | Freq: Four times a day (QID) | ORAL | 0 refills | Status: DC | PRN

## 2017-11-12 MED ORDER — KETOROLAC TROMETHAMINE 60 MG/2ML IM SOLN
60.0000 mg | Freq: Once | INTRAMUSCULAR | Status: AC
  Administered 2017-11-12: 60 mg via INTRAMUSCULAR

## 2017-11-12 MED FILL — VIT D2 1.25 MG (50,000 UNIT: 1.25 MG | 28 days supply | Qty: 4 | Fill #4

## 2017-11-12 MED FILL — HYDROCODON-APAP 5-325: 5-325 | 4 days supply | Qty: 16 | Fill #0

## 2017-11-12 MED FILL — ESTRADIOL 2 MG TABLET: 2 | 30 days supply | Qty: 30 | Fill #1

## 2017-11-12 NOTE — Patient Instructions (Signed)
For your recent severe pain after fall yesterday and today, I will get x-rays of all the areas of concern.  Since you have severe pain, I would recommend that you return to our waiting room while we wait for the radiologist read report.  If we do find he has a fracture then would try to get you in with orthopedist or refer you to after Hours orthopedic urgent care.  Also we might be able to find you a sling to use for the referral.   We gave you Toradol 60 mg mg IM in an attempt to reduce the pain.  Tomorrow you could start meloxicam.  Hold off until tomorrow afternoon.  Am going to go ahead and prescribe the Norco 4-day prescription..   Follow-up date to be determined after x-ray review.

## 2017-11-12 NOTE — Progress Notes (Signed)
Subjective:    Patient ID: Brianna Raymond, female    DOB: 05/30/1976, 41 y.o.   MRN: [id number]  HPI  Pt in for states she had been seeing Dr. Pearletha ForgeHudnall for 2 weeks for rt knee pain.  Pt states went to PT yesterday. She fell yesterday and hit counter and hit door not on cabinet with rt arm.  After therapy  she states rt knee was hurting and felt like gave out. But she did not hit patella directly   She states outside of her rt arm/elbow hit counter.     Then again today at work she fell on her rt side again.   Pt has been having problems with knee pain for 2 years or more. Just recently got worse. Knee has felt week since PT yesterday  Pt had US with Dr. Pearletha ForgeHudnall but no xray.  2 years ago after knee injury she had mri. [place].  Knee pain is hutring more than usual.  Pt states landed on rt hip today but has no pain in hip.    Review of Systems  Constitutional: Negative for chills, fatigue and fever.  Respiratory: Negative for cough, chest tightness and shortness of breath.   Cardiovascular: Negative for palpitations.  Gastrointestinal: Negative for abdominal distention, abdominal pain and diarrhea.  Musculoskeletal:       See HPI  Skin: Negative for rash.  Neurological: Negative for dizziness, seizures, syncope, weakness, numbness and headaches.  Hematological: Negative for adenopathy. Does not bruise/bleed easily.  Psychiatric/Behavioral: Negative for behavioral problems and confusion.    Past Medical History:  Diagnosis Date  . Anemia      Social History   Socioeconomic History  . Marital status: Single    Spouse name: Not on file  . Number of children: Not on file  . Years of education: Not on file  . Highest education level: Not on file  Social Needs  . Financial resource strain: Not on file  . Food insecurity - worry: Not on file  . Food insecurity - inability: Not on file  . Transportation needs - medical: Not on file  . Transportation needs  - non-medical: Not on file  Occupational History  . Not on file  Tobacco Use  . Smoking status: Never Smoker  . Smokeless tobacco: Never Used  Substance and Sexual Activity  . Alcohol use: No  . Drug use: No  . Sexual activity: Not on file  Other Topics Concern  . Not on file  Social History Narrative  . Not on file    Past Surgical History:  Procedure Laterality Date  . ABDOMINAL HYSTERECTOMY    . APPENDECTOMY    . BILATERAL SALPINGOOPHORECTOMY N/A 2018   Dr. Doristine JohnsPridham  . CESAREAN SECTION    . LAPAROSCOPIC GASTRIC BANDING      No family history on file.  Allergies  Allergen Reactions  . Penicillins Shortness Of Breath and Swelling  . Promethazine Nausea And Vomiting    SEVERE  . Codeine Nausea And Vomiting  . Latex Hives and Swelling    Current Outpatient Medications on File Prior to Visit  Medication Sig Dispense Refill  . cetirizine (ZYRTEC) 10 MG tablet Take 10 mg by mouth daily.    . ergocalciferol (VITAMIN D2) 50000 units capsule Take 1 capsule (50,000 Units total) by mouth once a week. 12 capsule 3  . estradiol (ESTRACE) 2 MG tablet TAKE 1 TABLET (2 MG TOTAL) BY MOUTH DAILY. 90 tablet 0  .  ferrous sulfate 325 (65 FE) MG EC tablet Take 1 tablet (325 mg total) by mouth 2 (two) times daily as needed. Take 1 or 2 tablets daily 180 tablet 1  . fluticasone (FLONASE) 50 MCG/ACT nasal spray Place into the nose.    . ibuprofen (ADVIL,MOTRIN) 600 MG tablet Take 600 mg by mouth every 6 (six) hours as needed for moderate pain.    . meloxicam (MOBIC) 15 MG tablet Take 1 tablet (15 mg total) daily by mouth. 30 tablet 2  . traMADol (ULTRAM) 50 MG tablet Take 1 tablet (50 mg total) every 6 (six) hours as needed by mouth. 20 tablet 0   No current facility-administered medications on file prior to visit.     BP 133/78   Pulse 83   Temp 98.1 F (36.7 C) (Oral)   Resp 16   Wt 271 lb 3.2 oz (123 kg)   SpO2 100%   BMI 46.55 kg/m      Objective:   Physical  Exam  General- No acute distress. Pleasant patient. Neck- Full range of motion, no jvd Lungs- Clear, even and unlabored. Heart- regular rate and rhythm. Neurologic- CNII- XII grossly intact.  Rt shoulder- good rom. No pain on palpaiton. Rt humerus- distal 1/3 humerus very tender to palpation. Rt elbow- pain on palpation and ROM Rt forearm- pain on palpation. Rt wrist- faint pain on palpation.  Rt knee- medial aspect tenderness over tibial plateau.        Assessment & Plan:  For your recent severe pain after fall yesterday and today, I will get x-rays of all the areas of concern.  Since you have severe pain, I would recommend that you return to our waiting room while we wait for the radiologist read report.  If we do find he has a fracture then would try to get you in with orthopedist or refer you to after Hours orthopedic urgent care.  Also we might be able to find you a sling to use for the referral.   We gave you Toradol 60 mg mg IM in an attempt to reduce the pain.  Tomorrow you could start meloxicam.  Hold off until tomorrow afternoon.  Am going to go ahead and prescribe the Norco 4-day prescription..   Follow-up date to be determined after x-ray review.  Patient is crying and appears to be in severe pain.  I reviewed with her whether or not she has had hydrocodone before.  She states that she has had hydrocodone before with no side effects or allergic type reaction.  Patient did come back to wait for x-ray overread.  There were no fractures seen.  Advised patient follow-up on next Wednesday or Thursday.  Clinically patient still has areas of concern would schedule her for repeat x-ray on next Friday.    Meshelle Holness, Ramon DredgeEdward, PA-C

## 2017-11-16 ENCOUNTER — Ambulatory Visit: Payer: 59

## 2017-11-18 ENCOUNTER — Encounter: Payer: Self-pay | Admitting: Medical

## 2017-11-18 ENCOUNTER — Ambulatory Visit: Payer: 59

## 2017-11-18 ENCOUNTER — Ambulatory Visit: Payer: 59 | Admitting: Medical

## 2017-11-18 VITALS — BP 128/84 | HR 83 | Temp 98.0°F | Resp 16 | Wt 271.6 lb

## 2017-11-18 DIAGNOSIS — R262 Difficulty in walking, not elsewhere classified: Secondary | ICD-10-CM

## 2017-11-18 DIAGNOSIS — G8929 Other chronic pain: Secondary | ICD-10-CM

## 2017-11-18 DIAGNOSIS — M25561 Pain in right knee: Secondary | ICD-10-CM | POA: Diagnosis not present

## 2017-11-18 DIAGNOSIS — R2689 Other abnormalities of gait and mobility: Secondary | ICD-10-CM

## 2017-11-18 DIAGNOSIS — M25521 Pain in right elbow: Secondary | ICD-10-CM

## 2017-11-18 DIAGNOSIS — R29898 Other symptoms and signs involving the musculoskeletal system: Secondary | ICD-10-CM

## 2017-11-18 NOTE — Progress Notes (Signed)
Subjective:    Patient ID: Brianna BlenderKari Lathon, female    DOB: 04/07/1976, 41 y.o.   MRN: [id number]  HPI  Pt in for follow up.   Pain had fall with severe pain in elbow after fall. Xray was negative. Now pain level is 2/10. Last time pain was 10/10 and she was crying.   Pt states other areas forearm, wrist and knee pain resolved.(but on exam faint 2/10 knee pain) But knee is the for which has been going to PT.    Pt ran out of pain meds on Tuesday night. Still using meloxicam  Pt has been using her elbow for past 2 days with low level pain.Occasional sharper/higher level pain.    Review of Systems  Constitutional: Negative for chills, fatigue and fever.  Respiratory: Negative for cough, chest tightness, shortness of breath and wheezing.   Cardiovascular: Negative for chest pain and palpitations.  Gastrointestinal: Negative for abdominal pain.  Musculoskeletal:       Rt elbow- lateral elbow pain on palpation. Some bruising seen. No swelling.  Skin: Negative for rash.  Neurological: Negative for dizziness, speech difficulty, weakness, light-headedness and numbness.  Hematological: Negative for adenopathy. Does not bruise/bleed easily.  Psychiatric/Behavioral: Negative for behavioral problems, confusion, self-injury, sleep disturbance and suicidal ideas. The patient is not nervous/anxious.     Past Medical History:  Diagnosis Date  . Anemia      Social History   Socioeconomic History  . Marital status: Single    Spouse name: Not on file  . Number of children: Not on file  . Years of education: Not on file  . Highest education level: Not on file  Social Needs  . Financial resource strain: Not on file  . Food insecurity - worry: Not on file  . Food insecurity - inability: Not on file  . Transportation needs - medical: Not on file  . Transportation needs - non-medical: Not on file  Occupational History  . Not on file  Tobacco Use  . Smoking status: Never Smoker  . Smokeless  tobacco: Never Used  Substance and Sexual Activity  . Alcohol use: No  . Drug use: No  . Sexual activity: Not on file  Other Topics Concern  . Not on file  Social History Narrative  . Not on file    Past Surgical History:  Procedure Laterality Date  . ABDOMINAL HYSTERECTOMY    . APPENDECTOMY    . BILATERAL SALPINGOOPHORECTOMY N/A 2018   Dr. Doristine JohnsPridham  . CESAREAN SECTION    . LAPAROSCOPIC GASTRIC BANDING      No family history on file.  Allergies  Allergen Reactions  . Penicillins Shortness Of Breath and Swelling  . Promethazine Nausea And Vomiting    SEVERE  . Codeine Nausea And Vomiting  . Latex Hives and Swelling    Current Outpatient Medications on File Prior to Visit  Medication Sig Dispense Refill  . cetirizine (ZYRTEC) 10 MG tablet Take 10 mg by mouth daily.    . ergocalciferol (VITAMIN D2) 50000 units capsule Take 1 capsule (50,000 Units total) by mouth once a week. 12 capsule 3  . estradiol (ESTRACE) 2 MG tablet TAKE 1 TABLET (2 MG TOTAL) BY MOUTH DAILY. 90 tablet 0  . ferrous sulfate 325 (65 FE) MG EC tablet Take 1 tablet (325 mg total) by mouth 2 (two) times daily as needed. Take 1 or 2 tablets daily 180 tablet 1  . fluticasone (FLONASE) 50 MCG/ACT nasal spray Place into the nose.    .Marland Kitchen  HYDROcodone-acetaminophen (NORCO) 5-325 MG tablet Take 1 tablet by mouth every 6 (six) hours as needed for moderate pain. 16 tablet 0  . ibuprofen (ADVIL,MOTRIN) 600 MG tablet Take 600 mg by mouth every 6 (six) hours as needed for moderate pain.    . meloxicam (MOBIC) 15 MG tablet Take 1 tablet (15 mg total) daily by mouth. 30 tablet 2  . traMADol (ULTRAM) 50 MG tablet Take 1 tablet (50 mg total) every 6 (six) hours as needed by mouth. 20 tablet 0   No current facility-administered medications on file prior to visit.     BP 128/84   Pulse 83   Temp 98 F (36.7 C) (Oral)   Resp 16   Wt 271 lb 9.6 oz (123.2 kg)   SpO2 99%   BMI 46.62 kg/m       Objective:   Physical  Exam  General- No acute distress. Pleasant patient. Neck- Full range of motion, no jvd Lungs- Clear, even and unlabored. Heart- regular rate and rhythm. Neurologic- CNII- XII grossly intact.  Rt shoulder- good rom. No pain on palpaiton. Rt humerus- distal 1/3 humerus no longer tender. Rt elbow- pain on palpation lateral epicondyle and mild on ROM Rt forearm- no  pain on palpation presently. Rt wrist- no pain on palpation.  Rt knee- medial aspect tenderness over tibial plateau.      Assessment & Plan:  For rt elbow pain that is persisting will get xray of your rt elbow. Make sure no fracture seen.   If no fracture but pain linger for more than 7 days can refer to sports med.  Continue mobic. Use ice to elbow twice daily. Can get tennis elbow brace and apply to lateral epicondyle as I explained. May want to wait 3 more days before applying the brace.  Follow up in 10 days or as needed  Kip Kautzman, Ramon DredgeEdward, VF CorporationPA-C

## 2017-11-18 NOTE — Patient Instructions (Signed)
For rt elbow pain that is persisting will get xray of your rt elbow. Make sure no fracture seen.   If no fracture but pain linger for more than 7 days can refer to sports med.  Continue mobic. Use ice to elbow twice daily. Can get tennis elbow brace and apply to lateral epicondyle as I explained. May want to wait 3 more days before applying the brace.  Follow up in 10 days or as needed

## 2017-11-18 NOTE — Therapy (Addendum)
[facility] [address]  [place], [place], [address] Phone: [phone]   Fax:  [phone]  Physical Therapy Treatment  Patient Details  Name: Brianna Raymond MRN: [id number] Date of Birth: 1976-11-10 Referring Provider: Dr. Karlton Lemon   Encounter Date: 11/18/2017  PT End of Session - 11/18/17 1543    Visit Number  3    Number of Visits  12    Date for PT Re-Evaluation  12/16/17    PT Start Time  3976    PT Stop Time  1618    PT Time Calculation (min)  48 min    Activity Tolerance  Patient tolerated treatment well    Behavior During Therapy  [facility] for tasks assessed/performed       Past Medical History:  Diagnosis Date  . Anemia     Past Surgical History:  Procedure Laterality Date  . ABDOMINAL HYSTERECTOMY    . APPENDECTOMY    . BILATERAL SALPINGOOPHORECTOMY N/A 2018   Dr. Floyde Parkins  . CESAREAN SECTION    . LAPAROSCOPIC GASTRIC BANDING      There were no vitals filed for this visit.  Subjective Assessment - 11/18/17 1537    Subjective  Pt. reporting fall Thursday after leaving therapy at home falling into counter onto R arm then fell to floor landing on buttocks.  Had two more falls Friday morning while at work.  First fall Friday falling into wall at work and able to self-recover, and second fall at desk at work falling onto R arm down to floor. Reports had x-rays on 12.7 with MD thinking hairline fracture at R arm and having additional x-rays on 12.14.18 to confirm.  Reports she has not performed HEP this past week due to falls and "my knee feels better".      Patient Stated Goals  improve pain and walking    Currently in Pain?  Yes    Pain Score  2     Pain Location  Knee    Pain Orientation  Right    Pain Descriptors / Indicators  Aching;Constant    Pain Type  Chronic pain    Pain Onset  More than a month ago    Pain Frequency  Constant    Aggravating Factors   stairs    Pain Relieving Factors   resting    Multiple Pain Sites  Yes    Pain Score  3    Pain Location  Arm    Pain Orientation  Right    Pain Descriptors / Indicators  Shooting    Pain Radiating Towards  Rfrom elbow to wrist     Pain Onset  In the past 7 days    Pain Frequency  Constant    Aggravating Factors   movement                       [facility] Adult PT Treatment/Exercise - 11/18/17 1548      Self-Care   Self-Care  Other Self-Care Comments      Knee/Hip Exercises: Aerobic   Nustep  NuStep: lvl 2, 12 min;  discussion with supervising PT and PTA regarding status       Knee/Hip Exercises: Seated   Long Arc Quad  Right;10 reps    Long Arc Quad Limitations  with pillow squeeze    Ball Squeeze  5" x 15 reps    Hamstring Curl  Right;10 reps    Hamstring  Limitations  red TB    Sit to Sand  5 reps from chair with airex pad; 2 hands pushoff       Knee/Hip Exercises: Supine   Bridges with [employer]  15 reps;Both adduction ball squeeze     Straight Leg Raises  Right;15 reps    Straight Leg Raises Limitations  only slight quad lag     Straight Leg Raise with External Rotation  Right;10 reps    Straight Leg Raise with External Rotation Limitations  only slight quad lag    Other Supine Knee/Hip Exercises  Adduction ball squeeze 5" x 15 reps       Knee/Hip Exercises: Prone   Hip Extension  Right;15 reps                  PT Long Term Goals - 11/11/17 1317      PT LONG TERM GOAL #1   Title  patient to be independent with advanced HEP    Status  On-going      PT LONG TERM GOAL #2   Title  patient to demonstrate good heel toe gait pattern with no evidence of instability     Status  On-going      PT LONG TERM GOAL #3   Title  patient to demonstrate R LE strength to >/= 4+/5 to allow for improved functional mobility    Status  On-going      PT LONG TERM GOAL #4   Title  patient to report no more than 2 falls per month    Status  On-going            Plan - 11/18/17 1900     Clinical Impression Statement  Brianna Raymond seen to start treatment reporting three falls in two days following last therapy visit with report of falling and impacting R arm with suspected hairline fracture near elbow receiving x-rays on 12.7.18.  MD wishing to do further X-rays to confirm fracture tomorrow.  Pt. reporting she has not performed HEP at all since falling and reports "my knee has actually felt better not doing the exercises".  Brianna Raymond able to perform strengthening therex today however reporting increased R knee pain as treatment progressed.  Reports limited benefit from past taping.  Brianna Raymond able to demo only slight quad lag with SLR activities in treatment and with inconsistent behavior, and reports of potential "buckling" at R knee, with standing therex.  Pt. behavior inconsistent with high reported pain levels today.  Will plan to progress as pt. able in coming visits.      PT Treatment/Interventions  ADLs/Self Care Home Management;Cryotherapy;Electrical Stimulation;Iontophoresis 82m/ml Dexamethasone;Moist Heat;Therapeutic exercise;Therapeutic activities;Functional mobility training;Stair training;Gait training;Balance training;Ultrasound;Neuromuscular re-education;Patient/family education;Manual techniques;Vasopneumatic Device;Taping;Passive range of motion;Dry needling    Consulted and Agree with Plan of Care  Patient       Patient will benefit from skilled therapeutic intervention in order to improve the following deficits and impairments:  Abnormal gait, Decreased activity tolerance, Decreased balance, Decreased mobility, Decreased strength, Difficulty walking, Pain  Visit Diagnosis: Chronic pain of right knee  Difficulty in walking, not elsewhere classified  Other abnormalities of gait and mobility  Other symptoms and signs involving the musculoskeletal system     Problem List Patient Active Problem List   Diagnosis Date Noted  . Iron deficiency anemia 02/10/2017  . Vitamin D  deficiency 02/10/2017  . Obesity 02/10/2017  . Pre-diabetes 02/10/2017  . Right knee pain 01/06/2017  . Migraine with aura and without status migrainosus,  not intractable 01/25/2015  . Allergic rhinitis, seasonal 01/25/2015  . Status following gastric banding surgery for weight loss 07/31/2011   Bess Harvest, PTA 11/18/17 7:08 PM  [facility] [address]  [place], [place], [address] Phone: [phone]   Fax:  [phone]  Name: Brianna Raymond MRN: [id number] Date of Birth: [birth year]   PHYSICAL THERAPY DISCHARGE SUMMARY  Visits from Start of Care: 3  Current functional level related to goals / functional outcomes:   Refer to above clinical impression for status as of last visit on 11/18/17. Pt was called out of town for a family emergency and has not returend in >30 days, therefore will proceed with discharge from PT for this episode.   Remaining deficits:  Unable to formally assess.   Education / Equipment:   HEP  Plan: Patient agrees to discharge.  Patient goals were not met. Patient is being discharged due to not returning since the last visit.  ?????    Percival Spanish, PT, MPT 01/17/18, 3:42 PM  [facility] [address]  [place], [place], [address] Phone: [phone]   Fax:  [phone]

## 2017-11-19 ENCOUNTER — Ambulatory Visit (HOSPITAL_BASED_OUTPATIENT_CLINIC_OR_DEPARTMENT_OTHER)
Admission: RE | Admit: 2017-11-19 | Discharge: 2017-11-19 | Disposition: A | Discharge status: Home / Self Care | Payer: 59 | Source: Ambulatory Visit | Attending: Medical | Admitting: Medical

## 2017-11-19 DIAGNOSIS — M25521 Pain in right elbow: Secondary | ICD-10-CM

## 2017-11-23 ENCOUNTER — Ambulatory Visit: Payer: 59

## 2017-11-25 ENCOUNTER — Ambulatory Visit: Payer: 59

## 2017-12-06 ENCOUNTER — Telehealth: Payer: Self-pay | Admitting: Family Medicine

## 2017-12-06 NOTE — Telephone Encounter (Signed)
Pt says that she has been seen twice by Ramon DredgeEdward for a elbow injury due to her falling. Pt says that she has completed her pain medication that was prescribed. Pt says that she is now starting to have sharp stabbing pain in her elbow. Pt would like to be advised further.     CB: [phone]

## 2017-12-08 NOTE — Telephone Encounter (Signed)
Can you call patient and offer her appointment on Friday at 1 PM.  Had 2 x-rays of her elbow with no fracture.  She might have condition called complex regional pain syndrome.  Can have pain high level not consistent with level of injury or might be something simpler like pain in soft tissue/tendons.  I might prescribe occasion for nerve pain or might have to refer to specialist.  Need to  reevaluate.

## 2017-12-09 NOTE — Telephone Encounter (Signed)
Could you call and schedule appointment.  

## 2017-12-09 NOTE — Telephone Encounter (Signed)
Appointment 12/10/2017.

## 2017-12-10 ENCOUNTER — Ambulatory Visit: Payer: 59 | Admitting: Medical

## 2017-12-10 ENCOUNTER — Encounter: Payer: Self-pay | Admitting: Medical

## 2017-12-10 VITALS — BP 129/65 | HR 98 | Temp 98.0°F | Ht 64.0 in | Wt 275.6 lb

## 2017-12-10 DIAGNOSIS — M25521 Pain in right elbow: Secondary | ICD-10-CM

## 2017-12-10 DIAGNOSIS — M25532 Pain in left wrist: Secondary | ICD-10-CM

## 2017-12-10 DIAGNOSIS — M792 Neuralgia and neuritis, unspecified: Secondary | ICD-10-CM

## 2017-12-10 MED ORDER — GABAPENTIN 100 MG PO CAPS: 100.0000 mg | ORAL_CAPSULE | Freq: Every day | ORAL | 0 refills | Status: DC

## 2017-12-10 MED FILL — GABAPENTIN 100 MG CAPSULE: 100 | 14 days supply | Qty: 14 | Fill #0

## 2017-12-10 NOTE — Progress Notes (Signed)
Subjective:    Patient ID: Brianna Raymond, female    DOB: 11/07/1976, 42 y.o.   MRN: [id number]  HPI  Pt is still having pain in her elbow. Pain since last visit. Pain never went away. See prior notes. 2 xrays negative. Pt states pain from elbow that is running to mid forearm.   Pt fell on 11-11-2017.   On last visit advised to continue mobic. She is taking mobic for knee as well. It does not help elbow pain.   Pt states sharp pain around  Elbow and forearm  Started again/flarded around sunday or Monday.  Pt states she feels like she can't fully extend the elbow.    Review of Systems  Constitutional: Negative for chills, fatigue and fever.  Respiratory: Negative for cough, chest tightness, shortness of breath and wheezing.   Cardiovascular: Negative for chest pain and palpitations.       Hx of palpitations randomly. In past related to stress. Worse with caffeine in past years ago.   Currently stable.   But notes with acitivity beating faster.  Gastrointestinal: Negative for abdominal pain.  Musculoskeletal: Negative for back pain and joint swelling.       Rt elbow pain. See hpi.  Skin: Negative for rash.  Hematological: Negative for adenopathy. Does not bruise/bleed easily.  Psychiatric/Behavioral: Negative for behavioral problems and confusion.    Past Medical History:  Diagnosis Date  . Anemia      Social History   Socioeconomic History  . Marital status: Single    Spouse name: Not on file  . Number of children: Not on file  . Years of education: Not on file  . Highest education level: Not on file  Social Needs  . Financial resource strain: Not on file  . Food insecurity - worry: Not on file  . Food insecurity - inability: Not on file  . Transportation needs - medical: Not on file  . Transportation needs - non-medical: Not on file  Occupational History  . Not on file  Tobacco Use  . Smoking status: Never Smoker  . Smokeless tobacco: Never Used  Substance and  Sexual Activity  . Alcohol use: No  . Drug use: No  . Sexual activity: Not on file  Other Topics Concern  . Not on file  Social History Narrative  . Not on file    Past Surgical History:  Procedure Laterality Date  . ABDOMINAL HYSTERECTOMY    . APPENDECTOMY    . BILATERAL SALPINGOOPHORECTOMY N/A 2018   Dr. Doristine JohnsPridham  . CESAREAN SECTION    . LAPAROSCOPIC GASTRIC BANDING      No family history on file.  Allergies  Allergen Reactions  . Penicillins Shortness Of Breath and Swelling  . Promethazine Nausea And Vomiting    SEVERE  . Codeine Nausea And Vomiting  . Latex Hives and Swelling    Current Outpatient Medications on File Prior to Visit  Medication Sig Dispense Refill  . ergocalciferol (VITAMIN D2) 50000 units capsule Take 1 capsule (50,000 Units total) by mouth once a week. 12 capsule 3  . estradiol (ESTRACE) 2 MG tablet TAKE 1 TABLET (2 MG TOTAL) BY MOUTH DAILY. 90 tablet 0  . ferrous sulfate 325 (65 FE) MG EC tablet Take 1 tablet (325 mg total) by mouth 2 (two) times daily as needed. Take 1 or 2 tablets daily 180 tablet 1  . ibuprofen (ADVIL,MOTRIN) 600 MG tablet Take 600 mg by mouth every 6 (six) hours as needed for  moderate pain.    . meloxicam (MOBIC) 15 MG tablet Take 1 tablet (15 mg total) daily by mouth. 30 tablet 2   No current facility-administered medications on file prior to visit.     BP 129/65   Pulse 98   Temp 98 F (36.7 C) (Oral)   Ht 5\' 4"  (1.626 m)   Wt 275 lb 9.6 oz (125 kg)   SpO2 99%   BMI 47.31 kg/m       Objective:   Physical Exam   General- No acute distress. Pleasant patient. Neck- Full range of motion, no jvd Lungs- Clear, even and unlabored. Heart- regular rate and rhythm. Neurologic- CNII- XII grossly intact.  Rt elbow- not warm, no swelling. Good range of motion. Only slight decreased extension. Lateral epicondyle tenderness to palpation. Good pronation and supination of forearm with no increase pain.  Left wrist- dorsal  aspect. Mild swelling over prior surgical.     Assessment & Plan:  For your recent right elbow pain that has persisted since your fall, I want to refer you to sports medicine.  Origin of the pain might be tendon over the lateral aspect of the elbow.  You also has some neuropathic type pain at times.  I want you to continue the meloxicam and I am going to add gabapentin 1 tablet at night.  For your left wrist pain and history of prior surgery for ganglion cyst, I am referring you to hand surgeon.  You mention briefly regarding your palpitations and history of anemia.  EKG offered but declined.  Blood work declined today as well.  Do recommend you get a pulse ox monitor over-the-counter.  You can check your pulse and see how often it exceeds 100.  This is the case please let us know.  I could refer you to cardiologist for Holter monitor.  Also when you are back in can order CBC and iron level.  Follow-up in 3-4 weeks or as needed.

## 2017-12-10 NOTE — Patient Instructions (Addendum)
For your recent right elbow pain that has persisted since your fall, I want to refer you to sports medicine.  Origin of the pain might be tendon over the lateral aspect of the elbow.  You also has some neuropathic type pain at times.  I want you to continue the meloxicam and I am going to add gabapentin 1 tablet at night.  For your left wrist pain and history of prior surgery for ganglion cyst, I am referring you to hand surgeon.  You mention briefly regarding your palpitations and history of anemia.  EKG offered but declined.  Blood work  declined today as well.  Do recommend you get a pulse ox monitor over-the-counter.  You can check your pulse and see how often it exceeds 100.  This is the case please let us know.  I could refer you to cardiologist for Holter monitor.  Also when you are back in can order CBC and iron level.  Follow-up in 3-4 weeks or as needed.

## 2017-12-17 ENCOUNTER — Encounter: Payer: Self-pay | Admitting: Family Medicine

## 2017-12-17 ENCOUNTER — Ambulatory Visit: Payer: 59 | Admitting: Family Medicine

## 2017-12-17 DIAGNOSIS — M25521 Pain in right elbow: Secondary | ICD-10-CM | POA: Diagnosis not present

## 2017-12-17 MED ORDER — PREDNISONE 10 MG PO TABS: ORAL_TABLET | ORAL | 0 refills | Status: DC

## 2017-12-17 MED FILL — predniSONE 10 MG TABS: 10 | 6 days supply | Qty: 21 | Fill #0

## 2017-12-17 NOTE — Patient Instructions (Addendum)
You have lateral epicondylitis including a partial common extensor tendon tear. Try to avoid painful activities as much as possible. Ice the area 3-4 times a day for 15 minutes at a time. Prednisone dose pack x 6 days - stop taking the meloxicam while you're on this. Counterforce brace or sleeve as directed can help unload area - wear this regularly if it provides you with relief. Wrist brace to help rest this as well. Call me next Thursday or Friday to let me know how you're doing. We will consider an MRI if not improving,  Follow up in 6 weeks.

## 2017-12-20 ENCOUNTER — Ambulatory Visit (INDEPENDENT_AMBULATORY_CARE_PROVIDER_SITE_OTHER): Payer: Self-pay | Admitting: Orthopaedic Surgery

## 2017-12-21 ENCOUNTER — Encounter: Payer: Self-pay | Admitting: Family Medicine

## 2017-12-21 DIAGNOSIS — M25521 Pain in right elbow: Secondary | ICD-10-CM | POA: Insufficient documentation

## 2017-12-21 NOTE — Progress Notes (Signed)
PCP: [name], Brianna C, MD Consultation requested by Esperanza RichtersEdward Saguier PA-Raymond  Subjective:   HPI: Patient is a 42 y.o. female here for right elbow pain.  Patient reports about 4 weeks ago she fell after her knee buckled on her and struck right elbow on a drawer then the ground laterally. She has had swelling laterally. Using a sling though difficult to wear at work. Took gabapentin last week. Associated sharp stabbing pain down to distal forearm. Pain level 2/10 but up to 10/10 and sharp. No skin changes, numbness. Right handed.  Past Medical History:  Diagnosis Date  . Anemia     Current Outpatient Medications on File Prior to Visit  Medication Sig Dispense Refill  . ergocalciferol (VITAMIN D2) 50000 units capsule Take 1 capsule (50,000 Units total) by mouth once a week. 12 capsule 3  . estradiol (ESTRACE) 2 MG tablet TAKE 1 TABLET (2 MG TOTAL) BY MOUTH DAILY. 90 tablet 0  . ferrous sulfate 325 (65 FE) MG EC tablet Take 1 tablet (325 mg total) by mouth 2 (two) times daily as needed. Take 1 or 2 tablets daily 180 tablet 1  . gabapentin (NEURONTIN) 100 MG capsule Take 1 capsule (100 mg total) by mouth at bedtime. 14 capsule 0  . ibuprofen (ADVIL,MOTRIN) 600 MG tablet Take 600 mg by mouth every 6 (six) hours as needed for moderate pain.    . meloxicam (MOBIC) 15 MG tablet Take 1 tablet (15 mg total) daily by mouth. 30 tablet 2   No current facility-administered medications on file prior to visit.     Past Surgical History:  Procedure Laterality Date  . ABDOMINAL HYSTERECTOMY    . APPENDECTOMY    . BILATERAL SALPINGOOPHORECTOMY N/A 2018   Dr. Doristine JohnsPridham  . CESAREAN SECTION    . LAPAROSCOPIC GASTRIC BANDING      Allergies  Allergen Reactions  . Penicillins Shortness Of Breath and Swelling  . Promethazine Nausea And Vomiting    SEVERE  . Codeine Nausea And Vomiting  . Latex Hives and Swelling    Social History   Socioeconomic History  . Marital status: Single    Spouse  name: Not on file  . Number of children: Not on file  . Years of education: Not on file  . Highest education level: Not on file  Social Needs  . Financial resource strain: Not on file  . Food insecurity - worry: Not on file  . Food insecurity - inability: Not on file  . Transportation needs - medical: Not on file  . Transportation needs - non-medical: Not on file  Occupational History  . Not on file  Tobacco Use  . Smoking status: Never Smoker  . Smokeless tobacco: Never Used  Substance and Sexual Activity  . Alcohol use: No  . Drug use: No  . Sexual activity: Not on file  Other Topics Concern  . Not on file  Social History Narrative  . Not on file    History reviewed. No pertinent family history.  BP 120/75   Pulse 90   Ht 5\' 4"  (1.626 m)   Wt 276 lb (125.2 kg)   BMI 47.38 kg/m   Review of Systems: See HPI above.     Objective:  Physical Exam:  Gen: NAD, comfortable in exam room  Right elbow: No gross deformity, swelling, bruising. TTP lateral epicondyle and just distal to this.  No other tenderness. FROM elbow but pain with extension.  FROM with wrist and digits but pain on  resisted wrist extension, 3rd digit extension, supination.  5/5 strength. Collateral ligaments intact. NVI distally.  Left elbow: No deformity. No TTP. FROM with 5/5 strength. Collateral ligaments intact. NVI distally.   MSK u/s right elbow:  Small partial insertional tear of common extensor tendon but most of this is intact.  No other abnormalities.  Assessment & Plan:  1. Right elbow pain - 2/2 lateral epicondylitis with small common extensor tendon tear.  Start with prednisone dose pack due to severity of her pain then switch back to meloxicam.  Sleeve or counterforce brace.  Wrist brace as well.  Let us know how she's doing next week.  Would like to avoid injection given this started with trauma and injection typically does not cure the problem, more likely to have pain at 1 year.   Icing.  Wait on home rehab and PT.

## 2017-12-21 NOTE — Progress Notes (Signed)
[facility] [place] [address], [address] [place], [place] [address] [phone] [phone]  Date:  12/22/2017   Name:  Brianna BlenderKari Raymond   DOB:  02/24/1976   MRN:  [id number]  PCP:  [name], Dajanee Voorheis C, MD    Chief Complaint: No chief complaint on file.   History of Present Illness:  Brianna Raymond is a 42 y.o. very pleasant female patient who presents with the following:  I saw her as a new patient back in March of 2018:   She has seen Dr. Pearletha ForgeHudnall for knee pain earlier this year [place].  She works as a Estate agentfinancial controller.  Admits that she is not very active at her job and that she has gained some weight due to inactivity  She had a hysterectomy for painful periods a few years ago- she had a combination of fibroids and endometriosis. She then had both of her ovaries removed about 18 months ago due to cysts.  She is now on oral estrogen and has not really noted any sx of menopause She is on estradiol daily She has a history of chronic pelvic spasms- better since her surgery.  She does use zanaflex prn for this and does need a refill History of low vitamin D and iron deficiency as well She does have a history of anemia which appeared 6-7 years ago. Has not gone away since her hysterectomy.  She does take iron between 325 and 650 mg a day She has a history of very sensitive skin. She has a few areas that concern her and she would like to see dermatology.  She has also noted an irritated rash under her breasts and pannus that has not resolved with a steroid crem She is fasting today She had lap band in 2009- it has been replaced twice  She does have a history of PVCs She has noted swelling of her legs that gets worse after she sits at her desk all day  She saw Ramon DredgeEdward recnetly for right elbow pain, and she referred her to [employer] Hudnall- she is now scheduled to see Dr. Roda ShuttersXu for a tendon tear in her right forearm. However  at her visit with Ramon DredgeEdward she also brought up palpations and history of anemia, so he asked her to come in for follow-up today  She would like to get a vitamin D level and CBC today She has history of iron def anemia and vitamin D def   She did cardiology years and years ago for palpiations- did a holter monitor but this was maybe 15 years ago  She does not have a cardiologist currently She may notice palpitations a couple of times a week- can last for a few hours or all day.  Do not seem related to exertion She will notice CP and SOB but only when she has the palpitations   Stress can make it worse  Her husband is here today and adds to the history   BP Readings from Last 3 Encounters:  12/22/17 119/80  12/17/17 120/75  12/10/17 129/65   Pulse Readings from Last 3 Encounters:  12/22/17 84  12/17/17 90  12/10/17 98    Patient Active Problem List   Diagnosis Date Noted  . Right elbow pain 12/21/2017  . Iron deficiency anemia 02/10/2017  . Vitamin D deficiency 02/10/2017  . Obesity 02/10/2017  . Pre-diabetes 02/10/2017  . Right knee pain 01/06/2017  .  Migraine with aura and without status migrainosus, not intractable 01/25/2015  . Allergic rhinitis, seasonal 01/25/2015  . Status following gastric banding surgery for weight loss 07/31/2011    Past Medical History:  Diagnosis Date  . Anemia     Past Surgical History:  Procedure Laterality Date  . ABDOMINAL HYSTERECTOMY    . APPENDECTOMY    . BILATERAL SALPINGOOPHORECTOMY N/A 2018   Dr. Doristine Johns  . CESAREAN SECTION    . LAPAROSCOPIC GASTRIC BANDING      Social History   Tobacco Use  . Smoking status: Never Smoker  . Smokeless tobacco: Never Used  Substance Use Topics  . Alcohol use: No  . Drug use: No    History reviewed. No pertinent family history.  Allergies  Allergen Reactions  . Penicillins Shortness Of Breath and Swelling  . Promethazine Nausea And Vomiting    SEVERE  . Codeine Nausea And Vomiting   . Latex Hives and Swelling    Medication list has been reviewed and updated.  Current Outpatient Medications on File Prior to Visit  Medication Sig Dispense Refill  . ergocalciferol (VITAMIN D2) 50000 units capsule Take 1 capsule (50,000 Units total) by mouth once a week. 12 capsule 3  . estradiol (ESTRACE) 2 MG tablet TAKE 1 TABLET (2 MG TOTAL) BY MOUTH DAILY. 90 tablet 0  . ferrous sulfate 325 (65 FE) MG EC tablet Take 1 tablet (325 mg total) by mouth 2 (two) times daily as needed. Take 1 or 2 tablets daily 180 tablet 1  . gabapentin (NEURONTIN) 100 MG capsule Take 1 capsule (100 mg total) by mouth at bedtime. 14 capsule 0  . ibuprofen (ADVIL,MOTRIN) 600 MG tablet Take 600 mg by mouth every 6 (six) hours as needed for moderate pain.    . meloxicam (MOBIC) 15 MG tablet Take 1 tablet (15 mg total) daily by mouth. 30 tablet 2  . predniSONE (DELTASONE) 10 MG tablet 6 tabs po day 1, 5 tabs po day 2, 4 tabs po day 3, 3 tabs po day 4, 2 tabs po day 5, 1 tab po day 6 21 tablet 0   No current facility-administered medications on file prior to visit.     Review of Systems:  As per HPI- otherwise negative.   Physical Examination: Vitals:   12/22/17 1251  BP: 119/80  Pulse: 84  Resp: 18  Temp: 98 F (36.7 C)  SpO2: 99%   Vitals:   12/22/17 1251  Weight: 276 lb 12.8 oz (125.6 kg)  Height: 5\' 4"  (1.626 m)   Body mass index is 47.51 kg/m. Ideal Body Weight: Weight in (lb) to have BMI = 25: 145.3  GEN: WDWN, NAD, Non-toxic, A & O x 3, obese, looks well HEENT: Atraumatic, Normocephalic. Neck supple. No masses, No LAD.  Bilateral TM wnl, oropharynx normal.  PEERL,EOMI.   Ears and Nose: No external deformity. CV: RRR, No M/G/R. No JVD. No thrill. No extra heart sounds. PULM: CTA B, no wheezes, crackles, rhonchi. No retractions. No resp. distress. No accessory muscle use. ABD: S, NT, ND, +BS. No rebound. No HSM. EXTR: No c/c/e NEURO Normal gait.  PSYCH: Normally interactive.  Conversant. Not depressed or anxious appearing.  Calm demeanor.  Wearing a right wrist splint and right elbow sleeve   EKG: NSR, no ST elevation or depression Assessment and Plan: Palpitations - Plan: EKG 12-Lead, Comprehensive metabolic panel, TSH, Ambulatory referral to Cardiology  Iron deficiency anemia, unspecified iron deficiency anemia type - Plan: CBC, Ferritin  Vitamin D deficiency - Plan: Vitamin D, 25-hydroxy  Here today with concern of long term palpitations and history of vitamin D def and anemia EKG today is normal Referral to cardiology- she endorses palpations that may last for hours sometimes, could have paroxysmal a fib Will plan further follow- up pending labs.   Signed Abbe Amsterdam, MD

## 2017-12-21 NOTE — Assessment & Plan Note (Signed)
2/2 lateral epicondylitis with small common extensor tendon tear.  Start with prednisone dose pack due to severity of her pain then switch back to meloxicam.  Sleeve or counterforce brace.  Wrist brace as well.  Let us know how she's doing next week.  Would like to avoid injection given this started with trauma and injection typically does not cure the problem, more likely to have pain at 1 year.  Icing.  Wait on home rehab and PT.

## 2017-12-22 ENCOUNTER — Encounter: Payer: Self-pay | Admitting: Family Medicine

## 2017-12-22 ENCOUNTER — Ambulatory Visit: Payer: 59 | Admitting: Family Medicine

## 2017-12-22 VITALS — BP 119/80 | HR 84 | Temp 98.0°F | Resp 18 | Ht 64.0 in | Wt 276.8 lb

## 2017-12-22 DIAGNOSIS — R002 Palpitations: Secondary | ICD-10-CM

## 2017-12-22 DIAGNOSIS — D509 Iron deficiency anemia, unspecified: Secondary | ICD-10-CM | POA: Diagnosis not present

## 2017-12-22 DIAGNOSIS — E559 Vitamin D deficiency, unspecified: Secondary | ICD-10-CM

## 2017-12-22 LAB — COMPREHENSIVE METABOLIC PANEL
ALT: 11 U/L (ref 0–35)
AST: 12 U/L (ref 0–37)
Albumin: 4 g/dL (ref 3.5–5.2)
Alkaline Phosphatase: 102 U/L (ref 39–117)
BILIRUBIN TOTAL: 0.4 mg/dL (ref 0.2–1.2)
BUN: 13 mg/dL (ref 6–23)
CALCIUM: 9.3 mg/dL (ref 8.4–10.5)
CO2: 32 mEq/L (ref 19–32)
Chloride: 103 mEq/L (ref 96–112)
Creatinine, Ser: 0.73 mg/dL (ref 0.40–1.20)
GFR: 93.52 mL/min (ref >60.00)
Glucose, Bld: 123 mg/dL — ABNORMAL HIGH (ref 70–99)
Potassium: 4 mEq/L (ref 3.5–5.1)
Sodium: 141 mEq/L (ref 135–145)
TOTAL PROTEIN: 7.3 g/dL (ref 6.0–8.3)

## 2017-12-22 LAB — CBC
HEMATOCRIT: 38.8 % (ref 36.0–46.0)
HEMOGLOBIN: 12.4 g/dL (ref 12.0–15.0)
MCHC: 32.1 g/dL (ref 30.0–36.0)
MCV: 85.2 fl (ref 78.0–100.0)
PLATELETS: 417 10*3/uL — AB (ref 150.0–400.0)
RBC: 4.55 Mil/uL (ref 3.87–5.11)
RDW: 14.5 % (ref 11.5–15.5)
WBC: 16 10*3/uL — AB (ref 4.0–10.5)

## 2017-12-22 LAB — VITAMIN D 25 HYDROXY (VIT D DEFICIENCY, FRACTURES): VITD: 30.53 ng/mL (ref 30.00–100.00)

## 2017-12-22 LAB — TSH: TSH: 1.86 u[IU]/mL (ref 0.35–4.50)

## 2017-12-22 LAB — FERRITIN: FERRITIN: 148.5 ng/mL (ref 10.0–291.0)

## 2017-12-22 NOTE — Patient Instructions (Signed)
It was good to see you today- please go to lab, and I will be in touch with your reports asap We will also set you up to see cardiology to further discuss your palpitations  Please let me know if any change or worsening of your symptoms in the meantime

## 2017-12-23 ENCOUNTER — Telehealth: Payer: Self-pay | Admitting: Family Medicine

## 2017-12-23 MED ORDER — TRIAMCINOLONE ACETONIDE 0.1 % EX CREA: 1.0000 "application " | TOPICAL_CREAM | Freq: Two times a day (BID) | CUTANEOUS | 3 refills | Status: DC

## 2017-12-23 MED FILL — TRIAMCINOLONE 0.1% CREAM: 0.1 | 14 days supply | Qty: 60 | Fill #0

## 2017-12-23 NOTE — Telephone Encounter (Signed)
Patient called and said she was in the office yesterday and the doctor was supposed to call her in a cream for eczema but it hasn't been done yet. Patient can't remember the name of cream and it is not on her medication list. [facility] [place] - [id number]  Have not seen note of this issue.

## 2017-12-23 NOTE — Telephone Encounter (Signed)
Copied from CRM [id number]. Topic: Quick Communication - See Telephone Encounter >> Dec 23, 2017  9:26 AM Guinevere FerrariMorris, [name], NT wrote: CRM for notification. See Telephone encounter for: Patient called and said she was in the office yesterday and the doctor was supposed to call her in a cream for eczema but it hasn't been done yet. Patient can't remember the name of cream and it is not on her medication list. [facility] [place] - [address] [phone] ([phone] (Fax).    12/23/17.

## 2017-12-24 ENCOUNTER — Ambulatory Visit (INDEPENDENT_AMBULATORY_CARE_PROVIDER_SITE_OTHER): Payer: Self-pay | Admitting: Orthopaedic Surgery

## 2017-12-24 ENCOUNTER — Telehealth: Payer: Self-pay | Admitting: Family Medicine

## 2017-12-24 NOTE — Telephone Encounter (Signed)
Patient called to follow up on right elbow pain. States she feels like the pain in getting worse. Patient says the compression sleeve she was instructed to wear hurts her arm and the wrist brace is heavy and feels like it is weighing her arm down and pulling at her elbow

## 2017-12-24 NOTE — Telephone Encounter (Signed)
We have a couple options here.  I don't think she would tolerate physical therapy at this point.  We could do MRI which is more sensitive than ultrasound at diagnosing degree of common extensor tendon tear but it's unlikely to change our management.  Other option would be to try a shot - it's not ideal to do this as we discussed but I don't know that there's much else that has the potential to help her with the pain.

## 2017-12-24 NOTE — Telephone Encounter (Signed)
Spoke to patient and gave her the information provided. She would like to do the MRI.

## 2017-12-24 NOTE — Telephone Encounter (Signed)
Ok to go ahead with MRI of her elbow - thanks!

## 2017-12-27 ENCOUNTER — Encounter: Payer: Self-pay | Admitting: Cardiology

## 2017-12-27 ENCOUNTER — Other Ambulatory Visit: Payer: Self-pay

## 2017-12-27 ENCOUNTER — Ambulatory Visit: Payer: 59 | Admitting: Cardiology

## 2017-12-27 VITALS — BP 116/78 | HR 104 | Ht 64.0 in | Wt 277.0 lb

## 2017-12-27 DIAGNOSIS — R002 Palpitations: Secondary | ICD-10-CM | POA: Diagnosis not present

## 2017-12-27 DIAGNOSIS — R0609 Other forms of dyspnea: Secondary | ICD-10-CM

## 2017-12-27 NOTE — Addendum Note (Signed)
Addended by: Kathi SimpersWISE, Chirag Krueger F on: 12/27/2017 04:51 PM   Modules accepted: Orders

## 2017-12-27 NOTE — Progress Notes (Signed)
Cardiology Office Note:    Date:  12/27/2017   ID:  [name], DOB 1976-01-20, MRN [id number]  PCP:  Pearline Cables, MD  Cardiologist:  Garwin Brothers, MD   Referring MD: Pearline Cables, MD    ASSESSMENT:    No diagnosis found. PLAN:    In order of problems listed above:  1. I discussed my findings with the patient at extensive length and primary prevention stressed with the patient.  Importance of compliance with diet stressed.  Weight reduction stressed.  Risks of obesity explained and she vocalized understanding. 2. In view of palpitations I told her about her normal thyroid testing.  Echocardiogram will be done to assess murmur heard on auscultation and the patient will undergo 48-hour Holter monitoring.  She will be seen in follow-up appointment in 3 weeks or earlier if she has any concerns.   Medication Adjustments/Labs and Tests Ordered: Current medicines are reviewed at length with the patient today.  Concerns regarding medicines are outlined above.  No orders of the defined types were placed in this encounter.  No orders of the defined types were placed in this encounter.    History of Present Illness:    Brianna Raymond is a 42 y.o. female who is being seen today for the evaluation of palpitations at the request of Copland, Gwenlyn Found, MD.  Patient is a pleasant 42 year old female.  She has past medical history of morbid obesity.  She is referred here for palpitations and elevated heart rate.  Noticed that her TSH done recently was within normal limits.  She leads a sedentary lifestyle.  She denies any chest pain orthopnea or PND.  She does give history of shortness of breath on exertion which has been so for quite some time.  No orthopnea or PND.  No syncope.  At the time of my evaluation, the patient is alert awake oriented and in no distress.  Past Medical History:  Diagnosis Date  . Anemia     Past Surgical History:  Procedure Laterality Date  . ABDOMINAL  HYSTERECTOMY    . APPENDECTOMY    . BILATERAL SALPINGOOPHORECTOMY N/A 2018   Dr. Doristine Johns  . CESAREAN SECTION    . LAPAROSCOPIC GASTRIC BANDING      Current Medications: Current Meds  Medication Sig  . ergocalciferol (VITAMIN D2) 50000 units capsule Take 1 capsule (50,000 Units total) by mouth once a week.  . estradiol (ESTRACE) 2 MG tablet TAKE 1 TABLET (2 MG TOTAL) BY MOUTH DAILY.  . ferrous sulfate 325 (65 FE) MG EC tablet Take 1 tablet (325 mg total) by mouth 2 (two) times daily as needed. Take 1 or 2 tablets daily  . ibuprofen (ADVIL,MOTRIN) 600 MG tablet Take 600 mg by mouth every 6 (six) hours as needed for moderate pain.  . meloxicam (MOBIC) 15 MG tablet Take 1 tablet (15 mg total) daily by mouth.  . triamcinolone cream (KENALOG) 0.1 % Apply 1 application topically 2 (two) times daily.     Allergies:   Penicillins; Promethazine; Caffeine; Codeine; and Latex   Social History   Socioeconomic History  . Marital status: Single    Spouse name: None  . Number of children: None  . Years of education: None  . Highest education level: None  Social Needs  . Financial resource strain: None  . Food insecurity - worry: None  . Food insecurity - inability: None  . Transportation needs - medical: None  . Transportation needs - non-medical: None  Occupational History  . None  Tobacco Use  . Smoking status: Never Smoker  . Smokeless tobacco: Never Used  Substance and Sexual Activity  . Alcohol use: No  . Drug use: No  . Sexual activity: None  Other Topics Concern  . None  Social History Narrative  . None     Family History: The patient's family history is not on file.  ROS:   Please see the history of present illness.    All other systems reviewed and are negative.  EKGs/Labs/Other Studies Reviewed:    The following studies were reviewed today: EKG done revealed sinus rhythm and nonspecific ST-T changes   Recent Labs: 12/22/2017: ALT 11; BUN 13; Creatinine, Ser  0.73; Hemoglobin 12.4; Platelets 417.0; Potassium 4.0; Sodium 141; TSH 1.86  Recent Lipid Panel    Component Value Date/Time   CHOL 145 02/10/2017 1024   TRIG 95.0 02/10/2017 1024   HDL 58.70 02/10/2017 1024   CHOLHDL 2 02/10/2017 1024   VLDL 19.0 02/10/2017 1024   LDLCALC 67 02/10/2017 1024    Physical Exam:    VS:  BP 116/78 (BP Location: Left Arm, Patient Position: Sitting, Cuff Size: Large)   Pulse (!) 104   Ht 5\' 4"  (1.626 m)   Wt 277 lb (125.6 kg)   SpO2 97%   BMI 47.55 kg/m     Wt Readings from Last 3 Encounters:  12/27/17 277 lb (125.6 kg)  12/22/17 276 lb 12.8 oz (125.6 kg)  12/17/17 276 lb (125.2 kg)     GEN: Patient is in no acute distress HEENT: Normal NECK: No JVD; No carotid bruits LYMPHATICS: No lymphadenopathy CARDIAC: S1 S2 regular, 2/6 systolic murmur at the apex. RESPIRATORY:  Clear to auscultation without rales, wheezing or rhonchi  ABDOMEN: Soft, non-tender, non-distended MUSCULOSKELETAL:  No edema; No deformity  SKIN: Warm and dry NEUROLOGIC:  Alert and oriented x 3 PSYCHIATRIC:  Normal affect    Signed, Garwin Brothersajan R Crystalle Popwell, MD  12/27/2017 4:58 PM    [facility]

## 2017-12-27 NOTE — Patient Instructions (Signed)
Medication Instructions:  Your physician recommends that you continue on your current medications as directed. Please refer to the Current Medication list given to you today.  Labwork: Your physician recommends that you have the following labs drawn: d-dimer  Testing/Procedures: Your physician has requested that you have an echocardiogram. Echocardiography is a painless test that uses sound waves to create images of your heart. It provides your doctor with information about the size and shape of your heart and how well your heart's chambers and valves are working. This procedure takes approximately one hour. There are no restrictions for this procedure.  Your physician has recommended that you wear a holter monitor. Holter monitors are medical devices that record the heart's electrical activity. Doctors most often use these monitors to diagnose arrhythmias. Arrhythmias are problems with the speed or rhythm of the heartbeat. The monitor is a small, portable device. You can wear one while you do your normal daily activities. This is usually used to diagnose what is causing palpitations/syncope (passing out).  Follow-Up: Your physician recommends that you schedule a follow-up appointment in: 3 weeks  Any Other Special Instructions Will Be Listed Below (If Applicable).     If you need a refill on your cardiac medications before your next appointment, please call your pharmacy.   [facility]  Garey Ham, RN, BSN    Holter Monitoring A Holter monitor is a small device that is used to detect abnormal heart rhythms. It clips to your clothing and is connected by wires to flat, sticky disks (electrodes) that attach to your chest. It is worn continuously for 24-48 hours. Follow these instructions at home:  Wear your Holter monitor at all times, even while exercising and sleeping, for as long as directed by your health care provider.  Make sure that the Holter monitor is safely clipped to  your clothing or close to your body as recommended by your health care provider.  Do not get the monitor or wires wet.  Do not put body lotion or moisturizer on your chest.  Keep your skin clean.  Keep a diary of your daily activities, such as walking and doing chores. If you feel that your heartbeat is abnormal or that your heart is fluttering or skipping a beat: ? Record what you are doing when it happens. ? Record what time of day the symptoms occur.  Return your Holter monitor as directed by your health care provider.  Keep all follow-up visits as directed by your health care provider. This is important. Get help right away if:  You feel lightheaded or you faint.  You have trouble breathing.  You feel pain in your chest, upper arm, or jaw.  You feel sick to your stomach and your skin is pale, cool, or damp.  You heartbeat feels unusual or abnormal. This information is not intended to replace advice given to you by your health care provider. Make sure you discuss any questions you have with your health care provider. Document Released: 08/21/2004 Document Revised: 04/30/2016 Document Reviewed: 07/02/2014 Elsevier Interactive Patient Education  2018 [employer]. Echocardiogram An echocardiogram, or echocardiography, uses sound waves (ultrasound) to produce an image of your heart. The echocardiogram is simple, painless, obtained within a short period of time, and offers valuable information to your health care provider. The images from an echocardiogram can provide information such as:  Evidence of coronary artery disease (CAD).  Heart size.  Heart muscle function.  Heart valve function.  Aneurysm detection.  Evidence of  a past heart attack.  Fluid buildup around the heart.  Heart muscle thickening.  Assess heart valve function.  Tell a health care provider about:  Any allergies you have.  All medicines you are taking, including vitamins, herbs, eye drops,  creams, and over-the-counter medicines.  Any problems you or family members have had with anesthetic medicines.  Any blood disorders you have.  Any surgeries you have had.  Any medical conditions you have.  Whether you are pregnant or may be pregnant. What happens before the procedure? No special preparation is needed. Eat and drink normally. What happens during the procedure?  In order to produce an image of your heart, gel will be applied to your chest and a wand-like tool (transducer) will be moved over your chest. The gel will help transmit the sound waves from the transducer. The sound waves will harmlessly bounce off your heart to allow the heart images to be captured in real-time motion. These images will then be recorded.  You may need an IV to receive a medicine that improves the quality of the pictures. What happens after the procedure? You may return to your normal schedule including diet, activities, and medicines, unless your health care provider tells you otherwise. This information is not intended to replace advice given to you by your health care provider. Make sure you discuss any questions you have with your health care provider. Document Released: 11/20/2000 Document Revised: 07/11/2016 Document Reviewed: 07/31/2013 Elsevier Interactive Patient Education  2017 [employer]

## 2017-12-28 LAB — D-DIMER, QUANTITATIVE (NOT AT ARMC): D-DIMER: 0.36 mg{FEU}/L (ref 0.00–0.49)

## 2017-12-29 ENCOUNTER — Other Ambulatory Visit (HOSPITAL_BASED_OUTPATIENT_CLINIC_OR_DEPARTMENT_OTHER): Payer: 59

## 2017-12-31 ENCOUNTER — Ambulatory Visit (INDEPENDENT_AMBULATORY_CARE_PROVIDER_SITE_OTHER): Payer: 59 | Admitting: Orthopaedic Surgery

## 2017-12-31 ENCOUNTER — Encounter (INDEPENDENT_AMBULATORY_CARE_PROVIDER_SITE_OTHER): Payer: Self-pay | Admitting: Orthopaedic Surgery

## 2017-12-31 ENCOUNTER — Ambulatory Visit (INDEPENDENT_AMBULATORY_CARE_PROVIDER_SITE_OTHER): Payer: 59

## 2017-12-31 DIAGNOSIS — M25532 Pain in left wrist: Secondary | ICD-10-CM

## 2017-12-31 MED ORDER — METHYLPREDNISOLONE ACETATE 40 MG/ML IJ SUSP
40.0000 mg | INTRAMUSCULAR | Status: AC | PRN
  Administered 2017-12-31: 40 mg

## 2017-12-31 MED ORDER — BUPIVACAINE HCL 0.5 % IJ SOLN
1.0000 mL | INTRAMUSCULAR | Status: AC | PRN
  Administered 2017-12-31: 1 mL

## 2017-12-31 MED ORDER — LIDOCAINE HCL 1 % IJ SOLN
1.0000 mL | INTRAMUSCULAR | Status: AC | PRN
  Administered 2017-12-31: 1 mL

## 2017-12-31 NOTE — Progress Notes (Signed)
Office Visit Note   Patient: Brianna Raymond           Date of Birth: 15-Mar-1976           MRN: [id number] Visit Date: 12/31/2017              Requested by: Pearline Cables, MD [address] [place], [place] [address] PCP: Pearline Cables, MD   Assessment & Plan: Visit Diagnoses:  1. Pain in left wrist     Plan: Impression is left wrist pain suspect carpal tunnel syndrome.  Injection was performed today into the carpal tunnel which patient tolerated well.  Recommend nighttime bracing for now.  If not better over the next 2-3 weeks patient should follow-up and we could consider nerve conduction studies versus MRI.  Patient declined nerve conduction studies today.  Follow-Up Instructions: Return if symptoms worsen or fail to improve.   Orders:  Orders Placed This Encounter  Procedures  . XR Wrist Complete Left   No orders of the defined types were placed in this encounter.     Procedures: Hand/UE Inj: L carpal tunnel for carpal tunnel syndrome on 12/31/2017 11:05 AM Details: 25 G needle Medications: 1 mL lidocaine 1 %; 1 mL bupivacaine 0.5 %; 40 mg methylPREDNISolone acetate 40 MG/ML Outcome: tolerated well, no immediate complications Patient was prepped and draped in the usual sterile fashion.       Clinical Data: No additional findings.   Subjective: Chief Complaint  Patient presents with  . Left Wrist - Pain    Patient is a 42 year old female comes in with left wrist pain that radiates pain into the radial 3 digits.  Denies any numbness and tingling.  She does endorse weakness of her strength and constant pain and dropping objects.  She was previously tested in 2015 for carpal tunnel syndrome which per report was negative.  She did have a dorsal wrist ganglion cyst removed in 1995.  Denies any injuries.  The pain is worse with activity.  She works as an Airline pilot and types all day.    Review of Systems  Constitutional: Negative.   HENT:  Negative.   Eyes: Negative.   Respiratory: Negative.   Cardiovascular: Negative.   Endocrine: Negative.   Musculoskeletal: Negative.   Neurological: Negative.   Hematological: Negative.   Psychiatric/Behavioral: Negative.   All other systems reviewed and are negative.    Objective: Vital Signs: There were no vitals taken for this visit.  Physical Exam  Constitutional: She is oriented to person, place, and time. She appears well-developed and well-nourished.  HENT:  Head: Normocephalic and atraumatic.  Eyes: EOM are normal.  Neck: Neck supple.  Pulmonary/Chest: Effort normal.  Abdominal: Soft.  Neurological: She is alert and oriented to person, place, and time.  Skin: Skin is warm. Capillary refill takes less than 2 seconds.  Psychiatric: She has a normal mood and affect. Her behavior is normal. Judgment and thought content normal.  Nursing note and vitals reviewed.   Ortho Exam Left wrist and hand exam shows fully healed surgical scars.  She has positive carpal tunnel compressive signs.  No atrophy.  DRUJ is nonsymptomatic to compression. Specialty Comments:  No specialty comments available.  Imaging: Xr Wrist Complete Left  Result Date: 12/31/2017 Ulnar negative variance.  No acute or structural findings    PMFS History: Patient Active Problem List   Diagnosis Date Noted  . Palpitations 12/27/2017  . Dyspnea on exertion 12/27/2017  . Right  elbow pain 12/21/2017  . Iron deficiency anemia 02/10/2017  . Vitamin D deficiency 02/10/2017  . Obesity 02/10/2017  . Pre-diabetes 02/10/2017  . Right knee pain 01/06/2017  . Migraine with aura and without status migrainosus, not intractable 01/25/2015  . Allergic rhinitis, seasonal 01/25/2015  . Status following gastric banding surgery for weight loss 07/31/2011   Past Medical History:  Diagnosis Date  . Anemia     History reviewed. No pertinent family history.  Past Surgical History:  Procedure Laterality Date  .  ABDOMINAL HYSTERECTOMY    . APPENDECTOMY    . BILATERAL SALPINGOOPHORECTOMY N/A 2018   Dr. Doristine JohnsPridham  . CESAREAN SECTION    . LAPAROSCOPIC GASTRIC BANDING     Social History   Occupational History  . Not on file  Tobacco Use  . Smoking status: Never Smoker  . Smokeless tobacco: Never Used  Substance and Sexual Activity  . Alcohol use: No  . Drug use: No  . Sexual activity: Not on file

## 2018-01-01 ENCOUNTER — Ambulatory Visit (HOSPITAL_BASED_OUTPATIENT_CLINIC_OR_DEPARTMENT_OTHER)
Admission: RE | Admit: 2018-01-01 | Discharge: 2018-01-01 | Disposition: A | Discharge status: Home / Self Care | Payer: 59 | Source: Ambulatory Visit | Attending: Family Medicine | Admitting: Family Medicine

## 2018-01-01 DIAGNOSIS — M25521 Pain in right elbow: Secondary | ICD-10-CM | POA: Insufficient documentation

## 2018-01-12 ENCOUNTER — Ambulatory Visit (HOSPITAL_BASED_OUTPATIENT_CLINIC_OR_DEPARTMENT_OTHER)
Admission: RE | Admit: 2018-01-12 | Discharge: 2018-01-12 | Disposition: A | Discharge status: Home / Self Care | Payer: 59 | Source: Ambulatory Visit | Attending: Cardiology | Admitting: Cardiology

## 2018-01-12 DIAGNOSIS — R002 Palpitations: Secondary | ICD-10-CM | POA: Diagnosis not present

## 2018-01-12 DIAGNOSIS — R0609 Other forms of dyspnea: Secondary | ICD-10-CM | POA: Diagnosis not present

## 2018-01-12 DIAGNOSIS — E119 Type 2 diabetes mellitus without complications: Secondary | ICD-10-CM | POA: Diagnosis not present

## 2018-01-12 NOTE — Progress Notes (Signed)
Echocardiogram 2D Echocardiogram has been performed.  Brianna Raymond, Brianna Raymond M 01/12/2018, 10:06 AM

## 2018-01-17 ENCOUNTER — Encounter: Payer: Self-pay | Admitting: Cardiology

## 2018-01-17 ENCOUNTER — Ambulatory Visit: Payer: 59 | Admitting: Cardiology

## 2018-01-17 VITALS — BP 128/80 | HR 104 | Ht 64.0 in | Wt 281.1 lb

## 2018-01-17 DIAGNOSIS — R002 Palpitations: Secondary | ICD-10-CM | POA: Diagnosis not present

## 2018-01-17 DIAGNOSIS — R0789 Other chest pain: Secondary | ICD-10-CM

## 2018-01-17 NOTE — Progress Notes (Signed)
Cardiology Office Note:    Date:  01/17/2018   ID:  Brianna BlenderKari Barlett, DOB 12/06/1976, MRN [id number]  PCP:  [name], [name], MD  Cardiologist:  Garwin Brothersajan R Lige Lakeman, MD   Referring MD: [name], [name], MD    ASSESSMENT:    1. Palpitations    PLAN:    In order of problems listed above:  1. Primary prevention stressed with the patient.  Importance of compliance with diet and medications stressed and she vocalized understanding.  Echocardiogram was discussed at length.  We are awaiting results of the Holter monitor. 2. Her chest pain appears atypical.  It has no radiation to any part of the body and not related to exertion, but in view of risk factors and exercise stress echo. 3. Risks of obesity explained to the patient in diet was discussed.  She plans to work on this very meticulously.  She will be seen in follow-up appointment in 3 months or earlier if she has any concerns.   Medication Adjustments/Labs and Tests Ordered: Current medicines are reviewed at length with the patient today.  Concerns regarding medicines are outlined above.  Orders Placed This Encounter  Procedures  . ECHOCARDIOGRAM STRESS TEST   No orders of the defined types were placed in this encounter.    Chief Complaint  Patient presents with  . Follow-up  . Palpitations     History of Present Illness:    Brianna Raymond is a 42 y.o. female.  Patient was evaluated by me for palpitations and her echocardiogram has been fine.  She is morbidly obese and leads a sedentary lifestyle.  She tells me that she plans to exercise on a regular basis.  No chest pain orthopnea or PND.  Patient mentions to me that she occasionally has chest pain and this is not related to exertion.  Past Medical History:  Diagnosis Date  . Anemia     Past Surgical History:  Procedure Laterality Date  . ABDOMINAL HYSTERECTOMY    . APPENDECTOMY    . BILATERAL SALPINGOOPHORECTOMY N/A 2018   Dr. Doristine JohnsPridham  . CESAREAN SECTION    .  LAPAROSCOPIC GASTRIC BANDING      Current Medications: Current Meds  Medication Sig  . ergocalciferol (VITAMIN D2) 50000 units capsule Take 1 capsule (50,000 Units total) by mouth once a week.  . estradiol (ESTRACE) 2 MG tablet TAKE 1 TABLET (2 MG TOTAL) BY MOUTH DAILY.  . ferrous sulfate 325 (65 FE) MG EC tablet Take 1 tablet (325 mg total) by mouth 2 (two) times daily as needed. Take 1 or 2 tablets daily  . ibuprofen (ADVIL,MOTRIN) 600 MG tablet Take 600 mg by mouth every 6 (six) hours as needed for moderate pain.  . meloxicam (MOBIC) 15 MG tablet Take 1 tablet (15 mg total) daily by mouth.  . triamcinolone cream (KENALOG) 0.1 % Apply 1 application topically 2 (two) times daily.     Allergies:   Penicillins; Promethazine; Caffeine; Codeine; and Latex   Social History   Socioeconomic History  . Marital status: Single    Spouse name: None  . Number of children: None  . Years of education: None  . Highest education level: None  Social Needs  . Financial resource strain: None  . Food insecurity - worry: None  . Food insecurity - inability: None  . Transportation needs - medical: None  . Transportation needs - non-medical: None  Occupational History  . None  Tobacco Use  . Smoking status: Never Smoker  .  Smokeless tobacco: Never Used  Substance and Sexual Activity  . Alcohol use: No  . Drug use: No  . Sexual activity: None  Other Topics Concern  . None  Social History Narrative  . None     Family History: The patient's family history includes Heart attack in her father; Stroke in her father and paternal grandmother.  ROS:   Please see the history of present illness.    All other systems reviewed and are negative.  EKGs/Labs/Other Studies Reviewed:    The following studies were reviewed today: I discussed my findings with the patient at extensive length echocardiogram report was discussed.  We are awaiting the results of the Holter monitor.   Recent  Labs: 12/22/2017: ALT 11; BUN 13; Creatinine, Ser 0.73; Hemoglobin 12.4; Platelets 417.0; Potassium 4.0; Sodium 141; TSH 1.86  Recent Lipid Panel    Component Value Date/Time   CHOL 145 02/10/2017 1024   TRIG 95.0 02/10/2017 1024   HDL 58.70 02/10/2017 1024   CHOLHDL 2 02/10/2017 1024   VLDL 19.0 02/10/2017 1024   LDLCALC 67 02/10/2017 1024    Physical Exam:    VS:  BP 128/80 (BP Location: Left Arm, Patient Position: Sitting, Cuff Size: Normal)   Pulse (!) 104   Ht 5\' 4"  (1.626 m)   Wt 281 lb 1.9 oz (127.5 kg)   SpO2 98%   BMI 48.25 kg/m     Wt Readings from Last 3 Encounters:  01/17/18 281 lb 1.9 oz (127.5 kg)  12/27/17 277 lb (125.6 kg)  12/22/17 276 lb 12.8 oz (125.6 kg)     GEN: Patient is in no acute distress HEENT: Normal NECK: No JVD; No carotid bruits LYMPHATICS: No lymphadenopathy CARDIAC: Hear sounds regular, 2/6 systolic murmur at the apex. RESPIRATORY:  Clear to auscultation without rales, wheezing or rhonchi  ABDOMEN: Soft, non-tender, non-distended MUSCULOSKELETAL:  No edema; No deformity  SKIN: Warm and dry NEUROLOGIC:  Alert and oriented x 3 PSYCHIATRIC:  Normal affect   Signed, Garwin Brothers, MD  01/17/2018 4:43 PM    [facility]

## 2018-01-17 NOTE — Patient Instructions (Signed)
Medication Instructions:  Your physician recommends that you continue on your current medications as directed. Please refer to the Current Medication list given to you today.  Labwork: None  Testing/Procedures: Your physician has requested that you have a stress echocardiogram. For further information please visit [email or web address]. Please follow instruction sheet as given.  Follow-Up: Your physician recommends that you schedule a follow-up appointment in: 3 months  Any Other Special Instructions Will Be Listed Below (If Applicable).     If you need a refill on your cardiac medications before your next appointment, please call your pharmacy.   [facility]  Doralene Glanz A, RN, BSN  

## 2018-01-24 ENCOUNTER — Encounter: Payer: Self-pay | Admitting: Nurse Practitioner

## 2018-01-24 ENCOUNTER — Ambulatory Visit: Payer: 59 | Admitting: Nurse Practitioner

## 2018-01-24 VITALS — BP 124/84 | HR 105 | Temp 97.8°F | Ht 64.0 in | Wt 280.0 lb

## 2018-01-24 DIAGNOSIS — R6889 Other general symptoms and signs: Secondary | ICD-10-CM

## 2018-01-24 DIAGNOSIS — J0101 Acute recurrent maxillary sinusitis: Secondary | ICD-10-CM

## 2018-01-24 LAB — POC INFLUENZA A&B (BINAX/QUICKVUE)
INFLUENZA B, POC: NEGATIVE
Influenza A, POC: NEGATIVE

## 2018-01-24 MED ORDER — FLUTICASONE PROPIONATE 50 MCG/ACT NA SUSP: 2.0000 | Freq: Every day | NASAL | 0 refills | Status: DC

## 2018-01-24 MED ORDER — AZITHROMYCIN 250 MG PO TABS: 250.0000 mg | ORAL_TABLET | Freq: Every day | ORAL | 0 refills | Status: DC

## 2018-01-24 MED ORDER — OXYMETAZOLINE HCL 0.05 % NA SOLN: 1.0000 | Freq: Two times a day (BID) | NASAL | 0 refills | Status: DC

## 2018-01-24 MED ORDER — SALINE SPRAY 0.65 % NA SOLN: 1.0000 | NASAL | 0 refills | Status: DC | PRN

## 2018-01-24 MED ORDER — GUAIFENESIN ER 600 MG PO TB12: 600.0000 mg | ORAL_TABLET | Freq: Two times a day (BID) | ORAL | 0 refills | Status: DC | PRN

## 2018-01-24 MED FILL — AZITHROMYCIN 250 MG TABLET: 250 | 5 days supply | Qty: 6 | Fill #0

## 2018-01-24 MED FILL — TRIAMCINOLONE 0.1% CREAM: 0.1 | 14 days supply | Qty: 60 | Fill #1

## 2018-01-24 MED FILL — SM ALLERGY RELIEF 50 MCG SP: 50 MCG | 30 days supply | Qty: 16 | Fill #0

## 2018-01-24 NOTE — Progress Notes (Signed)
Subjective:  Patient ID: Brianna BlenderKari Lachney, female    DOB: 10/23/1976  Age: 42 y.o. 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 The current episode started in the past 7 days. The problem has been gradually worsening since onset. The maximum temperature recorded prior to her arrival was 100.4 - 100.9 F. The fever has been present for 1 to 2 days. The pain is moderate. Associated symptoms include chills, congestion, coughing, ear pain, headaches, sinus pressure, a sore throat and swollen glands. Pertinent negatives include no hoarse voice or shortness of breath. Past treatments include oral decongestants. The treatment provided no relief.  reports sinusitis 2-3times year. Azithromycin last used 6months ago per patient.  Outpatient Medications Prior to Visit  Medication Sig Dispense Refill  . ergocalciferol (VITAMIN D2) 50000 units capsule Take 1 capsule (50,000 Units total) by mouth once a week. 12 capsule 3  . estradiol (ESTRACE) 2 MG tablet TAKE 1 TABLET (2 MG TOTAL) BY MOUTH DAILY. 90 tablet 0  . ferrous sulfate 325 (65 FE) MG EC tablet Take 1 tablet (325 mg total) by mouth 2 (two) times daily as needed. Take 1 or 2 tablets daily 180 tablet 1  . ibuprofen (ADVIL,MOTRIN) 600 MG tablet Take 600 mg by mouth every 6 (six) hours as needed for moderate pain.    . meloxicam (MOBIC) 15 MG tablet Take 1 tablet (15 mg total) daily by mouth. 30 tablet 2  . triamcinolone cream (KENALOG) 0.1 % Apply 1 application topically 2 (two) times daily. 60 g 3   No facility-administered medications prior to visit.     ROS See HPI  Objective:  BP 124/84   Pulse (!) 105   Temp 97.8 F (36.6 C)   Ht 5\' 4"  (1.626 m)   Wt 280 lb (127 kg)   SpO2 97%   BMI 48.06 kg/m   BP Readings from Last 3 Encounters:  01/24/18 124/84  01/17/18 128/80    12/27/17 116/78    Wt Readings from Last 3 Encounters:  01/24/18 280 lb (127 kg)  01/17/18 281 lb 1.9 oz (127.5 kg)  12/27/17 277 lb (125.6 kg)    Physical Exam  Constitutional: She is oriented to person, place, and time.  HENT:  Right Ear: Tympanic membrane, external ear and ear canal normal.  Left Ear: Tympanic membrane, external ear and ear canal normal.  Nose: Mucosal edema and rhinorrhea present. Right sinus exhibits maxillary sinus tenderness. Right sinus exhibits no frontal sinus tenderness. Left sinus exhibits maxillary sinus tenderness. Left sinus exhibits no frontal sinus tenderness.  Mouth/Throat: Uvula is midline. No trismus in the jaw. Posterior oropharyngeal erythema present. No oropharyngeal exudate.  Eyes: No scleral icterus.  Neck: Normal range of motion. Neck supple.  Cardiovascular: Normal rate and normal heart sounds.  Pulmonary/Chest: Effort normal and breath sounds normal.  Musculoskeletal: She exhibits no edema.  Lymphadenopathy:    She has cervical adenopathy.  Neurological: She is alert and oriented to person, place, and time.  Vitals reviewed.   Lab Results  Component Value Date   WBC 16.0 (H) 12/22/2017   HGB 12.4 12/22/2017   HCT 38.8 12/22/2017   PLT 417.0 (H) 12/22/2017   GLUCOSE 123 (H) 12/22/2017   CHOL 145 02/10/2017   TRIG 95.0 02/10/2017   HDL 58.70 02/10/2017   LDLCALC 67 02/10/2017   ALT 11  12/22/2017   AST 12 12/22/2017   NA 141 12/22/2017   K 4.0 12/22/2017   CL 103 12/22/2017   CREATININE 0.73 12/22/2017   BUN 13 12/22/2017   CO2 32 12/22/2017   TSH 1.86 12/22/2017   HGBA1C 5.8 02/10/2017    Assessment & Plan:   Bernardine was seen today for sinusitis.  Diagnoses and all orders for this visit:  Flu-like symptoms -     POC Influenza A&B(BINAX/QUICKVUE)  Recurrent maxillary sinusitis -     fluticasone (FLONASE) 50 MCG/ACT nasal spray; Place 2 sprays into both nostrils daily. -     sodium chloride (OCEAN) 0.65 % SOLN nasal  spray; Place 1 spray into both nostrils as needed for congestion. -     oxymetazoline (AFRIN NASAL SPRAY) 0.05 % nasal spray; Place 1 spray into both nostrils 2 (two) times daily. Use only for 3days, then stop -     guaiFENesin (MUCINEX) 600 MG 12 hr tablet; Take 1 tablet (600 mg total) by mouth 2 (two) times daily as needed for cough or to loosen phlegm. -     azithromycin (ZITHROMAX Z-PAK) 250 MG tablet; Take 1 tablet (250 mg total) by mouth daily. Take 2tabs on first day, then 1tab once a day till complete   I am having Brianna Raymond start on fluticasone, sodium chloride, oxymetazoline, guaiFENesin, and azithromycin. I am also having her maintain her ibuprofen, ergocalciferol, ferrous sulfate, estradiol, meloxicam, and triamcinolone cream.  Meds ordered this encounter  Medications  . fluticasone (FLONASE) 50 MCG/ACT nasal spray    Sig: Place 2 sprays into both nostrils daily.    Dispense:  16 g    Refill:  0    Order Specific Question:   Supervising Provider    Answer:   Dianne Dun [[id number]]  . sodium chloride (OCEAN) 0.65 % SOLN nasal spray    Sig: Place 1 spray into both nostrils as needed for congestion.    Dispense:  15 mL    Refill:  0    Order Specific Question:   Supervising Provider    Answer:   Dianne Dun [[id number]]  . oxymetazoline (AFRIN NASAL SPRAY) 0.05 % nasal spray    Sig: Place 1 spray into both nostrils 2 (two) times daily. Use only for 3days, then stop    Dispense:  30 mL    Refill:  0    Order Specific Question:   Supervising Provider    Answer:   Dianne Dun [[id number]]  . guaiFENesin (MUCINEX) 600 MG 12 hr tablet    Sig: Take 1 tablet (600 mg total) by mouth 2 (two) times daily as needed for cough or to loosen phlegm.    Dispense:  14 tablet    Refill:  0    Order Specific Question:   Supervising Provider    Answer:   Dianne Dun [[id number]]  . azithromycin (ZITHROMAX Z-PAK) 250 MG tablet    Sig: Take 1 tablet (250 mg total) by mouth daily. Take 2tabs on first day,  then 1tab once a day till complete    Dispense:  6 tablet    Refill:  0    Order Specific Question:   Supervising Provider    Answer:   Dianne Dun [[id number]]    Follow-up: Return if symptoms worsen or fail to improve.  Alysia Penna, NP

## 2018-01-24 NOTE — Patient Instructions (Addendum)
Discuss need for X-ray or CT sinus if sinusitis persist  URI Instructions: Flonase and Afrin use: apply 1spray of afrin in each nare, wait , then apply 2sprays of flonase in each nare. Use both nasal spray consecutively x 3days, then flonase only for at least 14days.  Encourage adequate oral hydration.  Use" Delsym" or" Robitussin" cough syrup varietis for cough.  You can use plain "Tylenol" or "Advi"l for fever, chills and achyness.

## 2018-02-14 ENCOUNTER — Other Ambulatory Visit: Payer: Self-pay | Admitting: Family Medicine

## 2018-02-14 ENCOUNTER — Ambulatory Visit (HOSPITAL_BASED_OUTPATIENT_CLINIC_OR_DEPARTMENT_OTHER): Payer: 59

## 2018-02-14 DIAGNOSIS — E559 Vitamin D deficiency, unspecified: Secondary | ICD-10-CM

## 2018-02-14 MED FILL — ESTRADIOL 2 MG TABLET: 2 | 30 days supply | Qty: 30 | Fill #2

## 2018-02-14 MED FILL — tiZANidine HCL 4 MG TABS: 4 | 10 days supply | Qty: 30 | Fill #0

## 2018-02-14 MED FILL — MELOXICAM 15 MG TABLET: 15 | 30 days supply | Qty: 30 | Fill #1

## 2018-02-14 MED FILL — TRIAMCINOLONE 0.1% CREAM: 0.1 | 14 days supply | Qty: 60 | Fill #2

## 2018-02-14 MED FILL — VIT D2 1.25 MG (50,000 UNIT: 1.25 MG | 28 days supply | Qty: 4 | Fill #0

## 2018-03-10 MED FILL — TRIAMCINOLONE 0.1% CREAM: 0.1 | 14 days supply | Qty: 60 | Fill #3

## 2018-03-22 NOTE — Telephone Encounter (Signed)
MRI performed on 1/26

## 2018-04-15 ENCOUNTER — Other Ambulatory Visit: Payer: Self-pay | Admitting: Family Medicine

## 2018-04-15 MED FILL — TRIAMCINOLONE 0.1% CREAM: 0.1 | 14 days supply | Qty: 60 | Fill #0

## 2018-04-18 MED FILL — VIT D2 1.25 MG (50,000 UNIT: 1.25 MG | 28 days supply | Qty: 4 | Fill #1

## 2018-04-18 MED FILL — tiZANidine HCL 4 MG TABS: 4 | 10 days supply | Qty: 30 | Fill #1

## 2018-05-07 ENCOUNTER — Other Ambulatory Visit: Payer: Self-pay

## 2018-05-07 ENCOUNTER — Emergency Department (HOSPITAL_BASED_OUTPATIENT_CLINIC_OR_DEPARTMENT_OTHER): Payer: Self-pay

## 2018-05-07 ENCOUNTER — Encounter (HOSPITAL_BASED_OUTPATIENT_CLINIC_OR_DEPARTMENT_OTHER): Payer: Self-pay | Admitting: Emergency Medicine

## 2018-05-07 ENCOUNTER — Emergency Department (HOSPITAL_BASED_OUTPATIENT_CLINIC_OR_DEPARTMENT_OTHER)
Admission: EM | Admit: 2018-05-07 | Discharge: 2018-05-07 | Disposition: A | Discharge status: Home / Self Care | Payer: Self-pay | Attending: Emergency Medicine | Admitting: Emergency Medicine

## 2018-05-07 DIAGNOSIS — S0990XA Unspecified injury of head, initial encounter: Secondary | ICD-10-CM

## 2018-05-07 DIAGNOSIS — Y9389 Activity, other specified: Secondary | ICD-10-CM | POA: Insufficient documentation

## 2018-05-07 DIAGNOSIS — S39012A Strain of muscle, fascia and tendon of lower back, initial encounter: Secondary | ICD-10-CM

## 2018-05-07 DIAGNOSIS — Y929 Unspecified place or not applicable: Secondary | ICD-10-CM | POA: Insufficient documentation

## 2018-05-07 DIAGNOSIS — S0083XA Contusion of other part of head, initial encounter: Secondary | ICD-10-CM

## 2018-05-07 DIAGNOSIS — M542 Cervicalgia: Secondary | ICD-10-CM | POA: Insufficient documentation

## 2018-05-07 DIAGNOSIS — Y999 Unspecified external cause status: Secondary | ICD-10-CM | POA: Insufficient documentation

## 2018-05-07 DIAGNOSIS — Z79899 Other long term (current) drug therapy: Secondary | ICD-10-CM | POA: Insufficient documentation

## 2018-05-07 DIAGNOSIS — W01198A Fall on same level from slipping, tripping and stumbling with subsequent striking against other object, initial encounter: Secondary | ICD-10-CM | POA: Insufficient documentation

## 2018-05-07 DIAGNOSIS — Z91040 Latex allergy status: Secondary | ICD-10-CM | POA: Insufficient documentation

## 2018-05-07 DIAGNOSIS — W19XXXA Unspecified fall, initial encounter: Secondary | ICD-10-CM

## 2018-05-07 MED ORDER — MORPHINE SULFATE (PF) 4 MG/ML IV SOLN
6.0000 mg | Freq: Once | INTRAVENOUS | Status: AC
  Administered 2018-05-07: 6 mg via INTRAMUSCULAR
  Filled 2018-05-07: qty 2

## 2018-05-07 MED ORDER — OXYCODONE-ACETAMINOPHEN 5-325 MG PO TABS
2.0000 | ORAL_TABLET | Freq: Once | ORAL | Status: AC
  Administered 2018-05-07: 2 via ORAL
  Filled 2018-05-07: qty 2

## 2018-05-07 MED ORDER — HYDROCODONE-ACETAMINOPHEN 5-325 MG PO TABS: 1.0000 | ORAL_TABLET | ORAL | 0 refills | Status: DC | PRN

## 2018-05-07 MED ORDER — CYCLOBENZAPRINE HCL 10 MG PO TABS: 10.0000 mg | ORAL_TABLET | Freq: Two times a day (BID) | ORAL | 0 refills | Status: DC | PRN

## 2018-05-07 NOTE — ED Triage Notes (Signed)
Patient states that she was playing with the dogs at home and tripped hitting her head and twisting her neck while falling into the wall. The patient complaints of head, neck and back pain

## 2018-05-07 NOTE — ED Triage Notes (Signed)
Patient placed in c- collar because of report of severe neck pain and the description of how the patient fell

## 2018-05-07 NOTE — ED Notes (Signed)
Pt on monitor 

## 2018-05-07 NOTE — ED Notes (Signed)
ED Provider at bedside. 

## 2018-05-07 NOTE — ED Notes (Signed)
Pts husband came to desk, requesting pain medication for patient.

## 2018-05-07 NOTE — ED Provider Notes (Signed)
[facility] EMERGENCY DEPARTMENT Provider Note   CSN: [id number] Arrival date & time: 05/07/18  1459     History   Chief Complaint Chief Complaint  Patient presents with  . Fall    HPI Brianna Raymond is a 42 y.o. female.  Patient is a 42 year old female who presents after a fall.  She states she was playing with her dog and tripped and fell forward, striking her face into a door jam.  There is no loss of consciousness.  She has pain to the right side of her face as well as her neck and her low back.  She has some tingling in her fingers but denies any numbness or weakness to her extremities.  She denies any other injuries from the fall.  She is not on anticoagulants.     Past Medical History:  Diagnosis Date  . Anemia     Patient Active Problem List   Diagnosis Date Noted  . Chest pain, atypical 01/17/2018  . Palpitations 12/27/2017  . Dyspnea on exertion 12/27/2017  . Right elbow pain 12/21/2017  . Iron deficiency anemia 02/10/2017  . Vitamin D deficiency 02/10/2017  . Obesity 02/10/2017  . Pre-diabetes 02/10/2017  . Right knee pain 01/06/2017  . Migraine with aura and without status migrainosus, not intractable 01/25/2015  . Allergic rhinitis, seasonal 01/25/2015  . Status following gastric banding surgery for weight loss 07/31/2011    Past Surgical History:  Procedure Laterality Date  . ABDOMINAL HYSTERECTOMY    . APPENDECTOMY    . BILATERAL SALPINGOOPHORECTOMY N/A 2018   Dr. Doristine Johns  . CESAREAN SECTION    . LAPAROSCOPIC GASTRIC BANDING       OB History   None      Home Medications    Prior to Admission medications   Medication Sig Start Date End Date Taking? Authorizing Provider  azithromycin (ZITHROMAX Z-PAK) 250 MG tablet Take 1 tablet (250 mg total) by mouth daily. Take 2tabs on first day, then 1tab once a day till complete 01/24/18   Nche, Bonna Gains, NP  cyclobenzaprine (FLEXERIL) 10 MG tablet Take 1 tablet (10 mg total) by mouth 2  (two) times daily as needed for muscle spasms. 05/07/18   Rolan Bucco, MD  estradiol (ESTRACE) 2 MG tablet TAKE 1 TABLET (2 MG TOTAL) BY MOUTH DAILY. 08/25/17   Copland, Gwenlyn Found, MD  ferrous sulfate 325 (65 FE) MG EC tablet Take 1 tablet (325 mg total) by mouth 2 (two) times daily as needed. Take 1 or 2 tablets daily 02/10/17   Copland, Gwenlyn Found, MD  fluticasone (FLONASE) 50 MCG/ACT nasal spray Place 2 sprays into both nostrils daily. 01/24/18   Nche, Bonna Gains, NP  guaiFENesin (MUCINEX) 600 MG 12 hr tablet Take 1 tablet (600 mg total) by mouth 2 (two) times daily as needed for cough or to loosen phlegm. 01/24/18   Nche, Bonna Gains, NP  HYDROcodone-acetaminophen (NORCO/VICODIN) 5-325 MG tablet Take 1-2 tablets by mouth every 4 (four) hours as needed. 05/07/18   Rolan Bucco, MD  ibuprofen (ADVIL,MOTRIN) 600 MG tablet Take 600 mg by mouth every 6 (six) hours as needed for moderate pain.    [provider]  meloxicam (MOBIC) 15 MG tablet Take 1 tablet (15 mg total) daily by mouth. 10/25/17   Hudnall, Azucena Fallen, MD  oxymetazoline (AFRIN NASAL SPRAY) 0.05 % nasal spray Place 1 spray into both nostrils 2 (two) times daily. Use only for 3days, then stop 01/24/18   Nche, Bonna Gains,  NP  sodium chloride (OCEAN) 0.65 % SOLN nasal spray Place 1 spray into both nostrils as needed for congestion. 01/24/18   Nche, Bonna Gains, NP  tiZANidine (ZANAFLEX) 4 MG tablet TAKE 1 TABLET (4 MG TOTAL) BY MOUTH EVERY 8 (EIGHT) HOURS AS NEEDED FOR MUSCLE SPASMS. 02/14/18   Copland, Gwenlyn Found, MD  triamcinolone cream (KENALOG) 0.1 % APPLY 1 APPLICATION ON TO THE SKIN 2 TIMES DAILY 04/15/18   Copland, Gwenlyn Found, MD  Vitamin D, Ergocalciferol, (DRISDOL) 50000 units CAPS capsule TAKE 1 CAPSULE (50,000 UNITS TOTAL) BY MOUTH ONCE A WEEK. 02/14/18   Copland, Gwenlyn Found, MD    Family History Family History  Problem Relation Age of Onset  . Heart attack Father   . Stroke Father   . Stroke Paternal Grandmother      Social History Social History   Tobacco Use  . Smoking status: Never Smoker  . Smokeless tobacco: Never Used  Substance Use Topics  . Alcohol use: No  . Drug use: No     Allergies   Penicillins; Promethazine; Caffeine; Codeine; and Latex   Review of Systems Review of Systems  Constitutional: Negative for activity change, appetite change and fever.  HENT: Positive for facial swelling. Negative for dental problem, nosebleeds and trouble swallowing.   Eyes: Negative for pain and visual disturbance.  Respiratory: Negative for shortness of breath.   Cardiovascular: Negative for chest pain.  Gastrointestinal: Negative for abdominal pain, nausea and vomiting.  Genitourinary: Negative for dysuria and hematuria.  Musculoskeletal: Positive for back pain and neck pain. Negative for arthralgias and joint swelling.  Skin: Negative for wound.  Neurological: Positive for numbness (Tingling to hands) and headaches. Negative for weakness.  Psychiatric/Behavioral: Negative for confusion.     Physical Exam Updated Vital Signs BP (!) 144/93   Pulse 92   Temp 98.3 F (36.8 C) (Oral)   Resp 15   Ht 5\' 4"  (1.626 m)   Wt 122.5 kg (270 lb)   SpO2 99%   BMI 46.35 kg/m   Physical Exam  Constitutional: She is oriented to person, place, and time. She appears well-developed and well-nourished.  HENT:  Nose: Nose normal.  No hemotympanum, patient has tenderness to the right side of the jaw and the right side of the nose.  There is no significant swelling or deformity in these areas.  No septal hematoma.  She does have a small amount of ecchymosis to her chin.  There is no suggestions of trauma to the anterior portion of her neck.  Eyes: Pupils are equal, round, and reactive to light. Conjunctivae are normal.  Neck:  Mild tenderness to the mid cervical spine.  There is no pain to the thoracic spine.  There is tenderness diffusely along the upper lumbosacral spine.  No step-offs or  deformities.  Cardiovascular: Normal rate and regular rhythm.  No murmur heard. No evidence of external trauma to the chest or abdomen  Pulmonary/Chest: Effort normal and breath sounds normal. No respiratory distress. She has no wheezes. She exhibits no tenderness.  Abdominal: Soft. Bowel sounds are normal. She exhibits no distension. There is no tenderness.  Musculoskeletal: Normal range of motion.  No pain on palpation or ROM of the extremities, motor 5 out of 5 all extremities, sensation grossly intact light touch all extremities  Neurological: She is alert and oriented to person, place, and time.  Skin: Skin is warm and dry. Capillary refill takes less than 2 seconds.  Psychiatric: She has a normal mood  and affect.  Vitals reviewed.    ED Treatments / Results  Labs (all labs ordered are listed, but only abnormal results are displayed) Labs Reviewed - No data to display  EKG None  Radiology Dg Lumbar Spine Complete  Result Date: 05/07/2018 CLINICAL DATA:  Pain after fall EXAM: LUMBAR SPINE - COMPLETE 4+ VIEW COMPARISON:  None. FINDINGS: The patient has a lap band. Soft tissues are otherwise normal. No fractures or malalignment. Minimal degenerative changes. No other acute abnormalities. IMPRESSION: No fracture or traumatic malalignment. Electronically Signed   By: Gerome Samavid  Williams III M.D   On: 05/07/2018 17:03   Ct Head Wo Contrast  Result Date: 05/07/2018 CLINICAL DATA:  Status post fall today with a blow to the head and face. Initial encounter. EXAM: CT HEAD WITHOUT CONTRAST CT MAXILLOFACIAL WITHOUT CONTRAST CT CERVICAL SPINE WITHOUT CONTRAST TECHNIQUE: Multidetector CT imaging of the head, cervical spine, and maxillofacial structures were performed using the standard protocol without intravenous contrast. Multiplanar CT image reconstructions of the cervical spine and maxillofacial structures were also generated. COMPARISON:  None. FINDINGS: CT HEAD FINDINGS Brain: No evidence of  acute infarction, hemorrhage, hydrocephalus, extra-axial collection or mass lesion/mass effect. Vascular: No hyperdense vessel or unexpected calcification. Skull: Intact.  No focal lesion. Other: None. CT MAXILLOFACIAL FINDINGS Osseous: No fracture or mandibular dislocation. No destructive process. Orbits: Negative. Sinuses: Clear. Soft tissues: Negative. CT CERVICAL SPINE FINDINGS Alignment: Normal. Skull base and vertebrae: No acute fracture. No primary bone lesion or focal pathologic process. Soft tissues and spinal canal: No prevertebral fluid or swelling. No visible canal hematoma. Disc levels: Disc height is maintained. Upper chest: Negative. Other: None. IMPRESSION: Negative exam. Electronically Signed   By: Drusilla Kannerhomas  Dalessio M.D.   On: 05/07/2018 16:57   Ct Cervical Spine Wo Contrast  Result Date: 05/07/2018 CLINICAL DATA:  Status post fall today with a blow to the head and face. Initial encounter. EXAM: CT HEAD WITHOUT CONTRAST CT MAXILLOFACIAL WITHOUT CONTRAST CT CERVICAL SPINE WITHOUT CONTRAST TECHNIQUE: Multidetector CT imaging of the head, cervical spine, and maxillofacial structures were performed using the standard protocol without intravenous contrast. Multiplanar CT image reconstructions of the cervical spine and maxillofacial structures were also generated. COMPARISON:  None. FINDINGS: CT HEAD FINDINGS Brain: No evidence of acute infarction, hemorrhage, hydrocephalus, extra-axial collection or mass lesion/mass effect. Vascular: No hyperdense vessel or unexpected calcification. Skull: Intact.  No focal lesion. Other: None. CT MAXILLOFACIAL FINDINGS Osseous: No fracture or mandibular dislocation. No destructive process. Orbits: Negative. Sinuses: Clear. Soft tissues: Negative. CT CERVICAL SPINE FINDINGS Alignment: Normal. Skull base and vertebrae: No acute fracture. No primary bone lesion or focal pathologic process. Soft tissues and spinal canal: No prevertebral fluid or swelling. No visible canal  hematoma. Disc levels: Disc height is maintained. Upper chest: Negative. Other: None. IMPRESSION: Negative exam. Electronically Signed   By: Drusilla Kannerhomas  Dalessio M.D.   On: 05/07/2018 16:57   Ct Maxillofacial Wo Cm  Result Date: 05/07/2018 CLINICAL DATA:  Status post fall today with a blow to the head and face. Initial encounter. EXAM: CT HEAD WITHOUT CONTRAST CT MAXILLOFACIAL WITHOUT CONTRAST CT CERVICAL SPINE WITHOUT CONTRAST TECHNIQUE: Multidetector CT imaging of the head, cervical spine, and maxillofacial structures were performed using the standard protocol without intravenous contrast. Multiplanar CT image reconstructions of the cervical spine and maxillofacial structures were also generated. COMPARISON:  None. FINDINGS: CT HEAD FINDINGS Brain: No evidence of acute infarction, hemorrhage, hydrocephalus, extra-axial collection or mass lesion/mass effect. Vascular: No hyperdense vessel or unexpected calcification.  Skull: Intact.  No focal lesion. Other: None. CT MAXILLOFACIAL FINDINGS Osseous: No fracture or mandibular dislocation. No destructive process. Orbits: Negative. Sinuses: Clear. Soft tissues: Negative. CT CERVICAL SPINE FINDINGS Alignment: Normal. Skull base and vertebrae: No acute fracture. No primary bone lesion or focal pathologic process. Soft tissues and spinal canal: No prevertebral fluid or swelling. No visible canal hematoma. Disc levels: Disc height is maintained. Upper chest: Negative. Other: None. IMPRESSION: Negative exam. Electronically Signed   By: Drusilla Kanner M.D.   On: 05/07/2018 16:57    Procedures Procedures (including critical care time)  Medications Ordered in ED Medications  morphine 4 MG/ML injection 6 mg (has no administration in time range)  oxyCODONE-acetaminophen (PERCOCET/ROXICET) 5-325 MG per tablet 2 tablet (2 tablets Oral Given 05/07/18 1536)     Initial Impression / Assessment and Plan / ED Course  I have reviewed the triage vital signs and the nursing  notes.  Pertinent labs & imaging results that were available during my care of the patient were reviewed by me and considered in my medical decision making (see chart for details).     Patient is a 42 year old female who presents after a fall.  She has headache associated with some swelling of her chin and pain to the right side of her face with low back pain.  CT scan of her head cervical spine facial bones are negative for acute trauma.  X-rays of her lumbosacral spine are negative for acute trauma.  She is neurologically intact.  She has full motion and sensation in all extremities.  She has some slight ecchymosis and swelling under her chin.  She has no airway issues.  No apparent damage to the anterior neck.  I do not see any risk factors for carotid artery damage.  She was discharged home in good condition.  She was given pain control in the ED.  She was given prescriptions for Flexeril and a few tablets of Vicodin.  She was encouraged to follow-up with her PCP.  Symptomatic care instructions were given.  Return precautions were given.  Final Clinical Impressions(s) / ED Diagnoses   Final diagnoses:  Fall, initial encounter  Injury of head, initial encounter  Contusion of face, initial encounter  Back strain, initial encounter    ED Discharge Orders        Ordered    HYDROcodone-acetaminophen (NORCO/VICODIN) 5-325 MG tablet  Every 4 hours PRN     05/07/18 1742    cyclobenzaprine (FLEXERIL) 10 MG tablet  2 times daily PRN     05/07/18 1742       Rolan Bucco, MD 05/07/18 1745

## 2018-05-07 NOTE — ED Notes (Signed)
Pts husband reports pain is worse.

## 2018-05-12 ENCOUNTER — Encounter: Payer: Self-pay | Admitting: Family Medicine

## 2018-05-12 ENCOUNTER — Ambulatory Visit (HOSPITAL_BASED_OUTPATIENT_CLINIC_OR_DEPARTMENT_OTHER)
Admission: RE | Admit: 2018-05-12 | Discharge: 2018-05-12 | Disposition: A | Discharge status: Home / Self Care | Payer: Self-pay | Source: Ambulatory Visit | Attending: Family Medicine | Admitting: Family Medicine

## 2018-05-12 ENCOUNTER — Ambulatory Visit (INDEPENDENT_AMBULATORY_CARE_PROVIDER_SITE_OTHER): Payer: Self-pay | Admitting: Family Medicine

## 2018-05-12 VITALS — BP 122/80 | HR 103 | Resp 18 | Ht 64.0 in | Wt 283.0 lb

## 2018-05-12 DIAGNOSIS — W19XXXD Unspecified fall, subsequent encounter: Secondary | ICD-10-CM

## 2018-05-12 DIAGNOSIS — S8001XA Contusion of right knee, initial encounter: Secondary | ICD-10-CM

## 2018-05-12 DIAGNOSIS — W19XXXA Unspecified fall, initial encounter: Secondary | ICD-10-CM | POA: Insufficient documentation

## 2018-05-12 DIAGNOSIS — S0083XD Contusion of other part of head, subsequent encounter: Secondary | ICD-10-CM

## 2018-05-12 DIAGNOSIS — B372 Candidiasis of skin and nail: Secondary | ICD-10-CM

## 2018-05-12 DIAGNOSIS — M25461 Effusion, right knee: Secondary | ICD-10-CM | POA: Insufficient documentation

## 2018-05-12 MED ORDER — HYDROCODONE-ACETAMINOPHEN 5-325 MG PO TABS: 1.0000 | ORAL_TABLET | ORAL | 0 refills | Status: AC | PRN

## 2018-05-12 MED ORDER — FLUCONAZOLE 150 MG PO TABS: 150.0000 mg | ORAL_TABLET | Freq: Once | ORAL | 0 refills | Status: AC

## 2018-05-12 MED FILL — TRIAMCINOLONE 0.1% CREAM: 0.1 | 14 days supply | Qty: 60 | Fill #1

## 2018-05-12 MED FILL — FLUCONAZOLE 150 MG TABS: 150 | 28 days supply | Qty: 4 | Fill #0

## 2018-05-12 MED FILL — HYDROCODON-APAP 5-325: 5-325 | 2 days supply | Qty: 10 | Fill #0

## 2018-05-12 NOTE — Patient Instructions (Signed)
It was good to see you today but I am so sorry that you got hurt!  We will get some x-rays of your right knee today to make sure no sign of a fracture I refilled your pain pills for you to use as needed  For rash on your side, take one diflucan pill a week for 4 weeks  We will have you be out of work today and tomorrow.  Next week you can work 4 hours per day until you feel ready to do full hours again

## 2018-05-12 NOTE — Progress Notes (Signed)
[facility] [address] [place], [place] [id number] [phone] [phone]  Date:  05/12/2018   Name:  Brianna Raymond Khouri   DOB:  02/03/1976   MRN:  [id number]  PCP:  [name], Creta Dorame C, MD    Chief Complaint: Fall (urgent care follow up, feeling better but not healed)   History of Present Illness:  Brianna Raymond [place]in is a 42 y.o. very pleasant female patient who presents with the following:  Following up from ER today- she was in the ER on 6/1 with injury Patient is a 42 year old female who presents after a fall.  She states she was playing with her dog and tripped and fell forward, striking her face into a door jam.  There is no loss of consciousness.  She has pain to the right side of her face as well as her neck and her low back.  She has some tingling in her fingers but denies any numbness or weakness to her extremities.  She denies any other injuries from the fall.  She is not on anticoagulants.///////////////////////////////////////////////////////////// Patient is a 42 year old female who presents after a fall.  She has headache associated with some swelling of her chin and pain to the right side of her face with low back pain.  CT scan of her head cervical spine facial bones are negative for acute trauma.  X-rays of her lumbosacral spine are negative for acute trauma.  She is neurologically intact.  She has full motion and sensation in all extremities.  She has some slight ecchymosis and swelling under her chin.  She has no airway issues.  No apparent damage to the anterior neck.  I do not see any risk factors for carotid artery damage.  She was discharged home in good condition.  She was given pain control in the ED.  She was given prescriptions for Flexeril and a few tablets of Vicodin.  She was encouraged to follow-up with her PCP.  Symptomatic care instructions were given.  Return precautions were given.  She is feeling better, but is still  hurting Her lower back hurts some, her knees- esp the right-  and her face are still tender She did not take any flexeril from the ER as she already has zanaflex Her hydrocodone ran out earlier this week; she got 10 pills and did use them  Her right knee is bothering her as well - this was not a main issue in her ER evaluation, but as her injury evolved she has noted more pain in her right knee and more difficulty walking  She did go back to work yesterday but had to leave early as she was having so much pain She is typically at work for 8 + hours a day  She feels like she is thinking pretty clearly, perhaps minimally slowed, no fever noted   She also has noted a tender, irritated rash in trunk skin folds that has not cleared up with her eczema cream- she was not sure what to do about this issue  Patient Active Problem List   Diagnosis Date Noted  . Chest pain, atypical 01/17/2018  . Palpitations 12/27/2017  . Dyspnea on exertion 12/27/2017  . Right elbow pain 12/21/2017  . Iron deficiency anemia 02/10/2017  . Vitamin D deficiency 02/10/2017  . Obesity 02/10/2017  . Pre-diabetes 02/10/2017  . Right knee pain 01/06/2017  . Migraine with aura and without status migrainosus, not intractable 01/25/2015  . Allergic rhinitis,  seasonal 01/25/2015  . Status following gastric banding surgery for weight loss 07/31/2011    Past Medical History:  Diagnosis Date  . Anemia     Past Surgical History:  Procedure Laterality Date  . ABDOMINAL HYSTERECTOMY    . APPENDECTOMY    . BILATERAL SALPINGOOPHORECTOMY N/A 2018   Dr. Doristine Johns  . CESAREAN SECTION    . LAPAROSCOPIC GASTRIC BANDING      Social History   Tobacco Use  . Smoking status: Never Smoker  . Smokeless tobacco: Never Used  Substance Use Topics  . Alcohol use: No  . Drug use: No    Family History  Problem Relation Age of Onset  . Heart attack Father   . Stroke Father   . Stroke Paternal Grandmother     Allergies   Allergen Reactions  . Penicillins Shortness Of Breath and Swelling  . Promethazine Nausea And Vomiting    SEVERE  . Caffeine     Heart races  . Codeine Nausea And Vomiting  . Latex Hives and Swelling    Medication list has been reviewed and updated.  Current Outpatient Medications on File Prior to Visit  Medication Sig Dispense Refill  . estradiol (ESTRACE) 2 MG tablet TAKE 1 TABLET (2 MG TOTAL) BY MOUTH DAILY. 90 tablet 0  . ferrous sulfate 325 (65 FE) MG EC tablet Take 1 tablet (325 mg total) by mouth 2 (two) times daily as needed. Take 1 or 2 tablets daily 180 tablet 1  . ibuprofen (ADVIL,MOTRIN) 600 MG tablet Take 600 mg by mouth every 6 (six) hours as needed for moderate pain.    . meloxicam (MOBIC) 15 MG tablet Take 1 tablet (15 mg total) daily by mouth. 30 tablet 2  . tiZANidine (ZANAFLEX) 4 MG tablet TAKE 1 TABLET (4 MG TOTAL) BY MOUTH EVERY 8 (EIGHT) HOURS AS NEEDED FOR MUSCLE SPASMS. 30 tablet 2  . triamcinolone cream (KENALOG) 0.1 % APPLY 1 APPLICATION ON TO THE SKIN 2 TIMES DAILY 60 g 2  . Vitamin D, Ergocalciferol, (DRISDOL) 50000 units CAPS capsule TAKE 1 CAPSULE (50,000 UNITS TOTAL) BY MOUTH ONCE A WEEK. 12 capsule 3   No current facility-administered medications on file prior to visit.     Review of Systems:  As per HPI- otherwise negative. Facial pain is better.  She is able to breathe and swallow ok No abd pain She does have lower back pain, she thinks from where her back "snapped back" when she fell   Physical Examination: Vitals:   05/12/18 1128  BP: 122/80  Pulse: (!) 103  Resp: 18  SpO2: 97%   Vitals:   05/12/18 1128  Weight: 283 lb (128.4 kg)  Height: 5\' 4"  (1.626 m)   Body mass index is 48.58 kg/m. Ideal Body Weight: Weight in (lb) to have BMI = 25: 145.3  GEN: WDWN, NAD, Non-toxic, A & O x 3, obese, facial busing on chin  HEENT: Atraumatic, Normocephalic. Neck supple. No masses, No LAD.  Bilateral TM wnl, oropharynx normal.  PEERL,EOMI.    She is tender under her chin and bruised in this area as well Ears and Nose: No external deformity. CV: RRR, No M/G/R. No JVD. No thrill. No extra heart sounds. PULM: CTA B, no wheezes, crackles, rhonchi. No retractions. No resp. distress. No accessory muscle use. EXTR: No c/c/e NEURO favoring right leg slightly today PSYCH: Normally interactive. Conversant. Not depressed or anxious appearing.  Calm demeanor.  Right knee: she is quite tender over the  medial joint line, and the anterior knee is bruised  Bilateral back muscles are tender at the thoracolumbar juncture but no bruise She has skin findings c/w candida intertrigo under her breasts and in skin folds on her trunk   Received her x-ray.  Called but no answer, cannot LMOM as VM is full.  Letter to pt   CLINICAL DATA: Pt states that she fell Saturday is having pain in her medial anterior right knee. She has a visible contusion on the area. Pt has LROM, can't bend knee all the way, pt had trouble rolling leg inward, this caused her a lot of pain.  EXAM: RIGHT KNEE - COMPLETE 4+ VIEW COMPARISON: 11/12/2017 FINDINGS: There is no acute fracture or subluxation. Small joint effusion is present. No radiopaque foreign body or soft tissue gas. IMPRESSION: Small joint effusion.  Pt given crutches to use, adjusted and shown how to use them  Assessment and Plan: Contusion of right knee, initial encounter - Plan: DG Knee Complete 4 Views Right, HYDROcodone-acetaminophen (NORCO/VICODIN) 5-325 MG tablet  Candidal intertrigo - Plan: fluconazole (DIFLUCAN) 150 MG tablet  Fall, subsequent encounter - Plan: HYDROcodone-acetaminophen (NORCO/VICODIN) 5-325 MG tablet  Contusion of face, subsequent encounter - Plan: HYDROcodone-acetaminophen (NORCO/VICODIN) 5-325 MG tablet  Here today after a fall that occurred on 6/1 She is getting better but is still sore and tired Given note for work for the rest of this week, and reduced work hours next  wek ?tibial plateau fracture.  Will obtain plain knee films today Use crutches as needed for pain- if fracture will refer to ortho Refilled her pain medication Asked her to alert me if she is not continuing to gradually get better     Signed Abbe Amsterdam, MD

## 2018-05-13 ENCOUNTER — Telehealth: Payer: Self-pay | Admitting: Family Medicine

## 2018-05-13 NOTE — Telephone Encounter (Signed)
Copied from CRM [id number]. Topic: Quick Communication - See Telephone Encounter >> May 13, 2018 12:31 PM Eston Mouldavis, Rogue Pautler B wrote: CRM for notification. See Telephone encounter for: 05/13/18.  PT is requesting results of rt knee xray

## 2018-05-13 NOTE — Telephone Encounter (Signed)
Called her and went over her x-rays; her VM was full Advised that no fracture noted but she does have some fluid in her joint- could be a ligamentous injury. We will see how she feels over the next couple of weeks- we are hopeful that she is just contused

## 2018-07-29 ENCOUNTER — Telehealth: Payer: Self-pay | Admitting: *Deleted

## 2018-07-29 NOTE — Telephone Encounter (Signed)
Ok- I would bet we can find 2 15 minute slots to put together for her in the next week so I can see her and do a physical. Ok to schedule her where-ever

## 2018-07-29 NOTE — Telephone Encounter (Signed)
Patient has been scheduled

## 2018-07-29 NOTE — Telephone Encounter (Signed)
Copied from CRM [phone]. Topic: Appointment Scheduling - Scheduling Inquiry for Clinic >> Jul 28, 2018  3:03 PM Stephannie LiSimmons, Janett L, [place] wrote: Reason for CRM: Patient called and needs a cpe for employment purposes by Wednesday 08/03/18 please advise [phone] there are none available with pcp

## 2018-07-30 NOTE — Progress Notes (Deleted)
[facility] [address] [place], [place] [address] [phone]  Date:  08/01/2018   Name:  Brianna Raymond   DOB:  Oct 09, 1976   MRN:  [id number]  PCP:  Pearline Cables, MD    Chief Complaint: No chief complaint on file.   History of Present Illness:  Brianna Raymond is a 42 y.o. very pleasant female patient who presents with the following:  Here today for a CPE History of obesity s/p lap band, migraine, pre-diabetes, vitamin D def  I last saw her in June after she injured her knee in a fall  She had her lap band removed earlier this month  Labs; due for complete Pap: s/p hyst Mammo: Immun:  ?tdap due  Patient Active Problem List   Diagnosis Date Noted  . Chest pain, atypical 01/17/2018  . Palpitations 12/27/2017  . Dyspnea on exertion 12/27/2017  . Right elbow pain 12/21/2017  . Iron deficiency anemia 02/10/2017  . Vitamin D deficiency 02/10/2017  . Obesity 02/10/2017  . Pre-diabetes 02/10/2017  . Right knee pain 01/06/2017  . Migraine with aura and without status migrainosus, not intractable 01/25/2015  . Allergic rhinitis, seasonal 01/25/2015  . Status following gastric banding surgery for weight loss 07/31/2011    Past Medical History:  Diagnosis Date  . Anemia     Past Surgical History:  Procedure Laterality Date  . ABDOMINAL HYSTERECTOMY    . APPENDECTOMY    . BILATERAL SALPINGOOPHORECTOMY N/A 2018   Dr. Doristine Johns  . CESAREAN SECTION    . LAPAROSCOPIC GASTRIC BANDING      Social History   Tobacco Use  . Smoking status: Never Smoker  . Smokeless tobacco: Never Used  Substance Use Topics  . Alcohol use: No  . Drug use: No    Family History  Problem Relation Age of Onset  . Heart attack Father   . Stroke Father   . Stroke Paternal Grandmother     Allergies  Allergen Reactions  . Penicillins Shortness Of Breath and Swelling  . Promethazine Nausea And Vomiting    SEVERE  .  Caffeine     Heart races  . Codeine Nausea And Vomiting  . Latex Hives and Swelling    Medication list has been reviewed and updated.  Current Outpatient Medications on File Prior to Visit  Medication Sig Dispense Refill  . estradiol (ESTRACE) 2 MG tablet TAKE 1 TABLET (2 MG TOTAL) BY MOUTH DAILY. 90 tablet 0  . ferrous sulfate 325 (65 FE) MG EC tablet Take 1 tablet (325 mg total) by mouth 2 (two) times daily as needed. Take 1 or 2 tablets daily 180 tablet 1  . HYDROcodone-acetaminophen (NORCO/VICODIN) 5-325 MG tablet Take 1-2 tablets by mouth every 4 (four) hours as needed. 10 tablet 0  . ibuprofen (ADVIL,MOTRIN) 600 MG tablet Take 600 mg by mouth every 6 (six) hours as needed for moderate pain.    . meloxicam (MOBIC) 15 MG tablet Take 1 tablet (15 mg total) daily by mouth. 30 tablet 2  . tiZANidine (ZANAFLEX) 4 MG tablet TAKE 1 TABLET (4 MG TOTAL) BY MOUTH EVERY 8 (EIGHT) HOURS AS NEEDED FOR MUSCLE SPASMS. 30 tablet 2  . triamcinolone cream (KENALOG) 0.1 % APPLY 1 APPLICATION ON TO THE SKIN 2 TIMES DAILY 60 g 2  . Vitamin D, Ergocalciferol, (DRISDOL) 50000 units CAPS capsule TAKE 1 CAPSULE (50,000 UNITS TOTAL) BY MOUTH ONCE A WEEK. 12 capsule 3  No current facility-administered medications on file prior to visit.     Review of Systems:  As per HPI- otherwise negative.   Physical Examination: There were no vitals filed for this visit. There were no vitals filed for this visit. There is no height or weight on file to calculate BMI. Ideal Body Weight:    GEN: WDWN, NAD, Non-toxic, A & O x 3 HEENT: Atraumatic, Normocephalic. Neck supple. No masses, No LAD. Ears and Nose: No external deformity. CV: RRR, No M/G/R. No JVD. No thrill. No extra heart sounds. PULM: CTA B, no wheezes, crackles, rhonchi. No retractions. No resp. distress. No accessory muscle use. ABD: S, NT, ND, +BS. No rebound. No HSM. EXTR: No c/c/e NEURO Normal gait.  PSYCH: Normally interactive. Conversant. Not  depressed or anxious appearing.  Calm demeanor.    Assessment and Plan: ***  Signed Abbe AmsterdamJessica Copland, MD

## 2018-08-01 ENCOUNTER — Encounter: Payer: Self-pay | Admitting: Family Medicine

## 2018-08-01 DIAGNOSIS — Z0289 Encounter for other administrative examinations: Secondary | ICD-10-CM

## 2018-08-09 ENCOUNTER — Encounter: Payer: Self-pay | Admitting: Family Medicine

## 2018-08-18 ENCOUNTER — Other Ambulatory Visit: Payer: Self-pay | Admitting: Family Medicine

## 2018-08-18 MED FILL — MELOXICAM 15 MG TABLET: 15 | 30 days supply | Qty: 30 | Fill #2

## 2018-08-18 MED FILL — VIT D2 1.25 MG (50,000 UNIT: 1.25 MG | 28 days supply | Qty: 4 | Fill #2

## 2018-08-19 NOTE — Telephone Encounter (Signed)
Pt. Requesting estradiol refill; last ordered 08/24/2017, #90, 0RF. LOV 05/12/18. Order pended, routed to Dr. Patsy Lageropland to approve.

## 2018-08-19 NOTE — Telephone Encounter (Signed)
Relation to pt: self  Call back number: [phone]  [facility]arm[facility]: [facility] [place] - [address] [phone] (Phone) [phone] (Fax)     Reason for call:  Patient checking on the status of estradiol (ESTRACE) 2 MG tablet stating she's completely out and would like Rx sent in today, informed patient please allow 48 to 72 hour turn around and PCP is currently out of the office, patient would like a follow up call regarding status. Please advise

## 2018-08-22 MED FILL — ESTRADIOL 2 MG TABLET: 2 | 30 days supply | Qty: 30 | Fill #0

## 2018-10-25 ENCOUNTER — Telehealth: Payer: Self-pay | Admitting: Family Medicine

## 2018-10-25 NOTE — Telephone Encounter (Signed)
Copied from CRM [id number]. Topic: Quick Communication - Rx Refill/Question >> Oct 25, 2018  8:49 AM Jolayne Hainesaylor, Brittany L wrote: Medication: Valtrex 1 gram, patient is having an outbreak of blisters on her mouth for about one week. She has enough to get her until tomorrow and is working 2 hours away right now. Patient is aware Dr Patsy Lageropland is out of the office today and would get the message when she returned tomorrow.   Has the patient contacted their pharmacy? No, old prescription (Agent: If no, request that the patient contact the pharmacy for the refill.) (Agent: If yes, when and what did the pharmacy advise?)  Preferred Pharmacy (with phone number or street name): [id number] - [place], [place] - [address] [place] [phone]    Agent: Please be advised that RX refills may take up to 3 business days. We ask that you follow-up with your pharmacy.

## 2018-10-26 NOTE — Telephone Encounter (Signed)
Patient is currently having fever blister outbreak on her mouth- she is on her last pill for treatment and it is not better. She had medication from her previous provider before she moved and established care with Dr Patsy Lageropland. The condition is not listed on her problem list or the medication is not on her medication list- so it makes it difficult to help her at this point- offered appointment- but she is out of town for 2 weeks. Patient is going to go to [facility] for [facility] now and she will discuss with Dr Patsy Lageropland when she sees her at her next appointment.

## 2018-10-26 NOTE — Telephone Encounter (Signed)
Ok noted  

## 2018-12-07 IMAGING — CT CT CERVICAL SPINE W/O CM
5 of 9 series · 12 of 33 positions shown, 13 images · non-contrast
Comparison: None.

CLINICAL DATA: Status post fall today with a blow to the head and
face. Initial encounter.

EXAM:
CT HEAD WITHOUT CONTRAST
CT MAXILLOFACIAL WITHOUT CONTRAST
CT CERVICAL SPINE WITHOUT CONTRAST
TECHNIQUE: Multidetector CT imaging of the head, cervical spine, and
maxillofacial structures were performed using the standard protocol
without intravenous contrast. Multiplanar CT image reconstructions
of the cervical spine and maxillofacial structures were also
generated.

[Series 6: max soft · axial · 0.40mm/px · z∈[-213,-161]mm · 2 of 80 slices shown]
[im 27/80  soft-tissue]
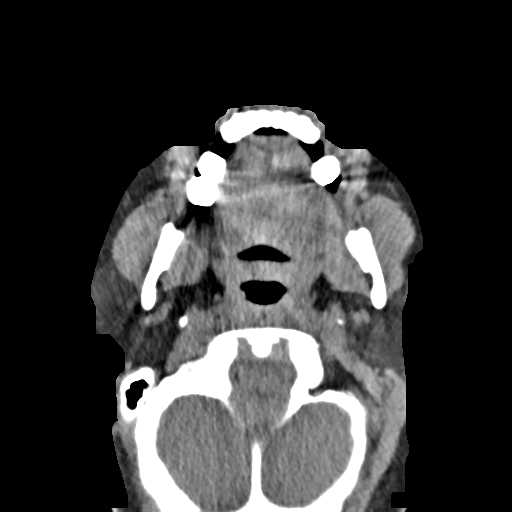
[im 53/80  soft-tissue]
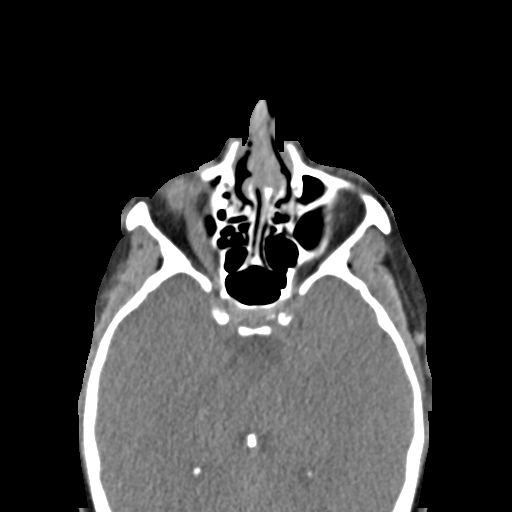

[Series 15: c spine soft · axial · 0.29mm/px · z∈[-305,-247]mm · 2 of 87 slices shown]
[im 29/87  soft-tissue]
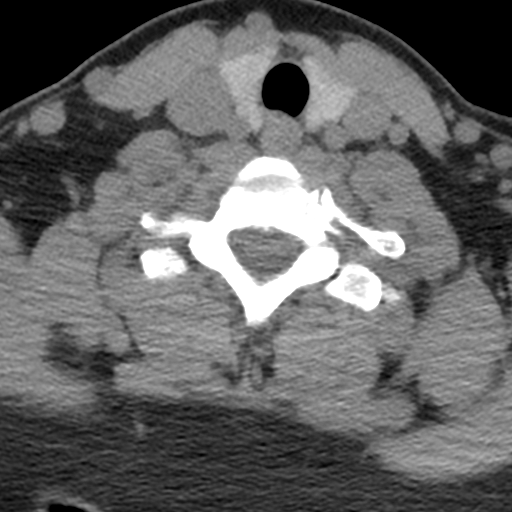
[im 58/87  soft-tissue]
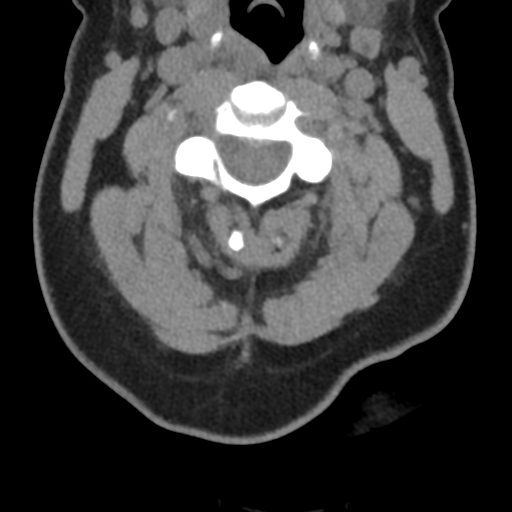

[Series 17: sagittal bone · sagittal · 0.22mm/px · 2 of 61 slices shown]
[im 21/61  bone]
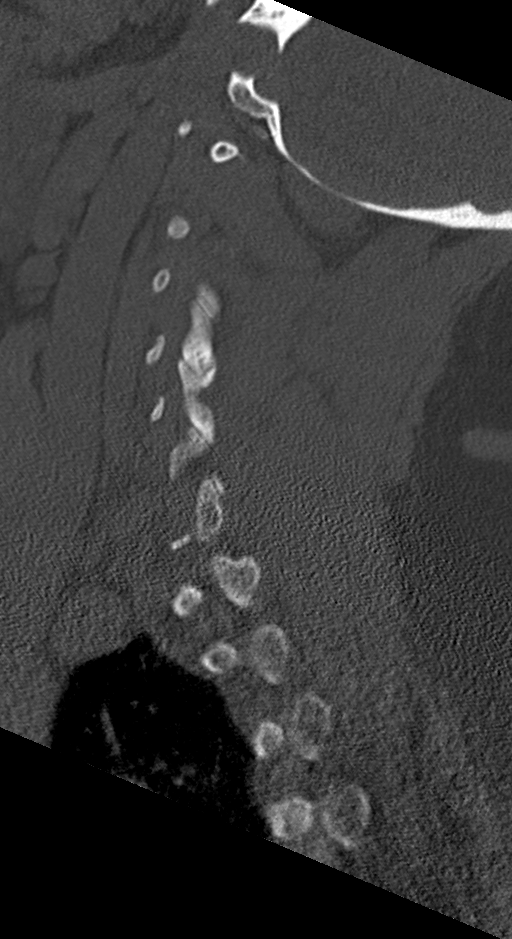
[im 41/61  bone]
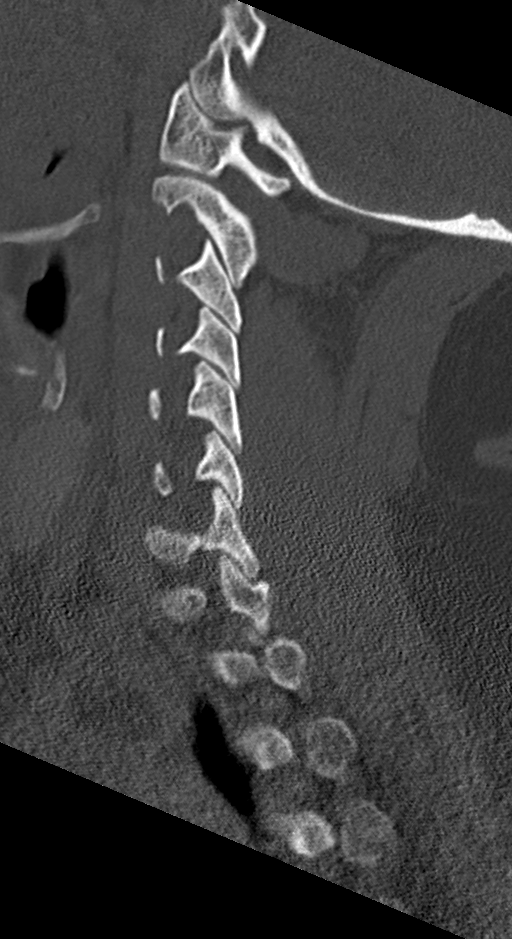

[Series 18: coronal bone · coronal · 0.23mm/px · 3 of 58 slices shown]
[im 53/58  bone]
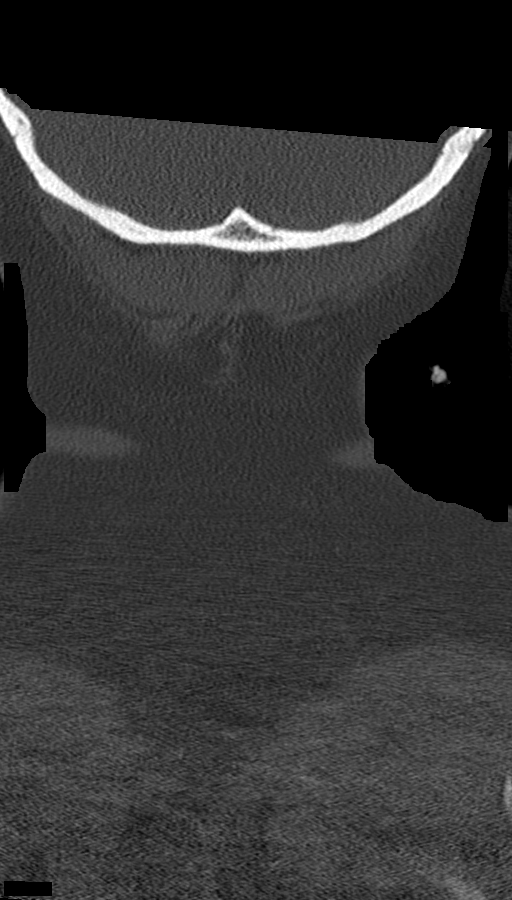
[im 54/58  bone]
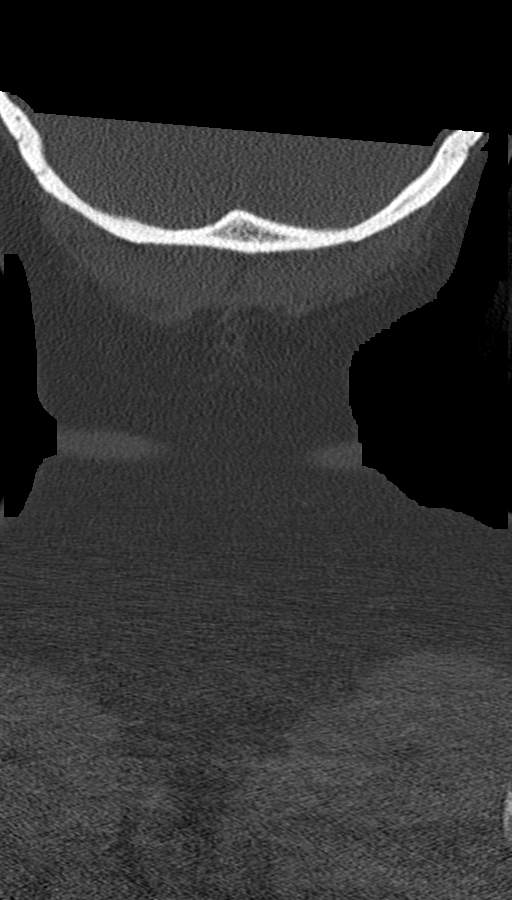
[im 55/58  bone]
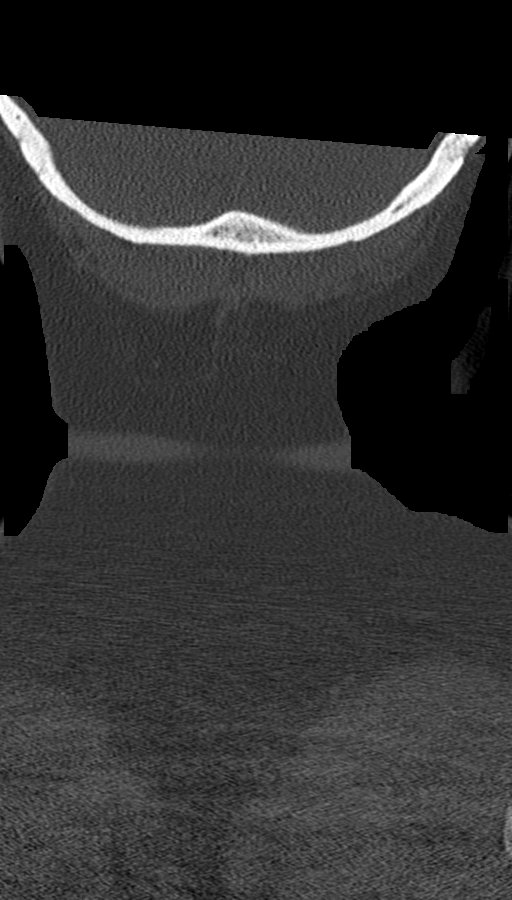

[Series 19: orthogonal axials · axial · 0.30mm/px · z∈[-370,-274]mm · 3 of 106 slices shown, 4 images]
[im 27/106  soft-tissue]
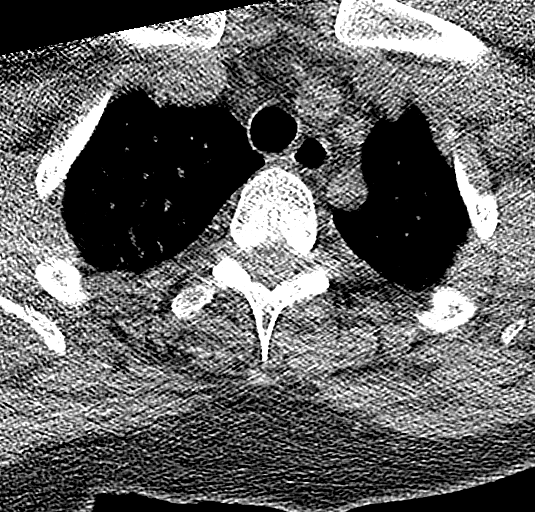
[im 27/106  bone]
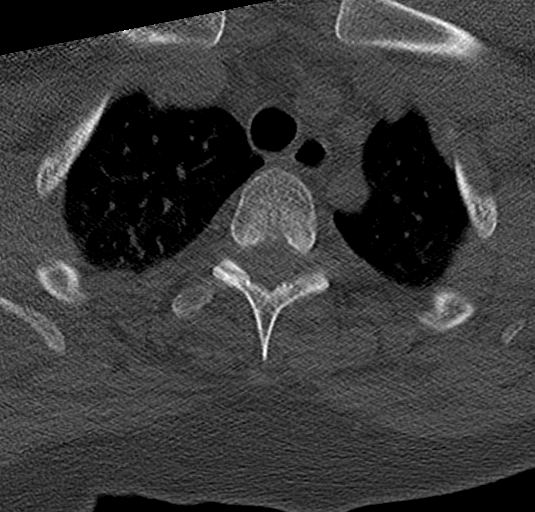
[im 53/106  bone]
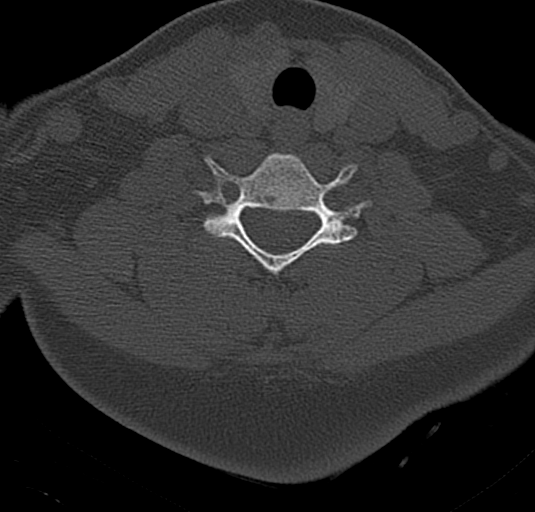
[im 79/106  bone]
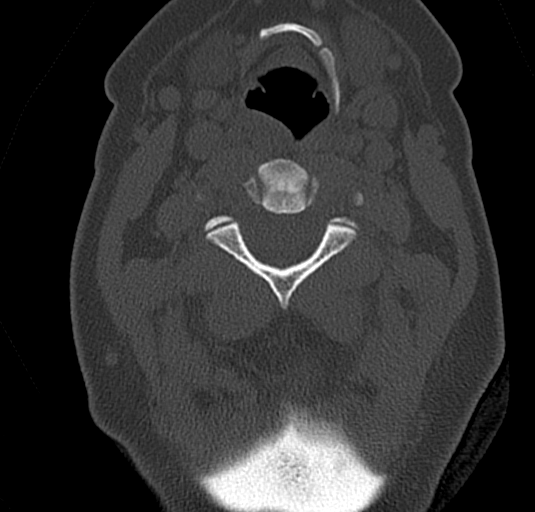

[12 of 33 positions shown; findings below may reference images not displayed]

FINDINGS: CT HEAD FINDINGS

Brain: No evidence of acute infarction, hemorrhage, hydrocephalus,
extra-axial collection or mass lesion/mass effect.

Vascular: No hyperdense vessel or unexpected calcification.

Skull: Intact.  No focal lesion.

Other: None.

CT MAXILLOFACIAL FINDINGS

Osseous: No fracture or mandibular dislocation. No destructive
process.

Orbits: Negative.

Sinuses: Clear.

Soft tissues: Negative.

CT CERVICAL SPINE FINDINGS

Alignment: Normal.

Skull base and vertebrae: No acute fracture. No primary bone lesion
or focal pathologic process.

Soft tissues and spinal canal: No prevertebral fluid or swelling. No
visible canal hematoma.

Disc levels: Disc height is maintained.

Upper chest: Negative.

Other: None.
IMPRESSION: Negative exam.
